# Patient Record
Sex: Female | Born: 1990 | Race: White | Hispanic: No | Marital: Single | Smoking: Former smoker
Health system: Southern US, Community
[De-identification: ages and names within clinical notes are randomized; demographics above are authoritative.]

## PROBLEM LIST (undated history)

## (undated) ENCOUNTER — Inpatient Hospital Stay: Payer: Self-pay

## (undated) DIAGNOSIS — M419 Scoliosis, unspecified: Secondary | ICD-10-CM

## (undated) DIAGNOSIS — Z789 Other specified health status: Secondary | ICD-10-CM

## (undated) DIAGNOSIS — G40909 Epilepsy, unspecified, not intractable, without status epilepticus: Secondary | ICD-10-CM

## (undated) DIAGNOSIS — N83209 Unspecified ovarian cyst, unspecified side: Secondary | ICD-10-CM

## (undated) HISTORY — DX: Epilepsy, unspecified, not intractable, without status epilepticus: G40.909

## (undated) HISTORY — PX: NO PAST SURGERIES: SHX2092

---
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
## 2010-06-04 ENCOUNTER — Emergency Department: Payer: Self-pay | Admitting: Internal Medicine

## 2010-12-26 ENCOUNTER — Inpatient Hospital Stay: Payer: Self-pay

## 2011-09-05 ENCOUNTER — Emergency Department: Payer: Self-pay | Admitting: Internal Medicine

## 2011-09-05 LAB — URINALYSIS, COMPLETE
Bilirubin,UR: NEGATIVE
Glucose,UR: NEGATIVE mg/dL (ref 0–75)
Ketone: NEGATIVE
Nitrite: NEGATIVE
RBC,UR: 11 /HPF (ref 0–5)
Specific Gravity: 1.02 (ref 1.003–1.030)
Squamous Epithelial: 5
WBC UR: 260 /HPF (ref 0–5)

## 2012-12-11 ENCOUNTER — Inpatient Hospital Stay: Payer: Self-pay | Admitting: Obstetrics and Gynecology

## 2012-12-11 LAB — CBC WITH DIFFERENTIAL/PLATELET
Basophil #: 0 10*3/uL (ref 0.0–0.1)
Basophil %: 0.3 %
HCT: 30.6 % — ABNORMAL LOW (ref 35.0–47.0)
HGB: 9.8 g/dL — ABNORMAL LOW (ref 12.0–16.0)
Lymphocyte %: 15.1 %
MCHC: 32.2 g/dL (ref 32.0–36.0)
MCV: 78 fL — ABNORMAL LOW (ref 80–100)
Monocyte %: 6.9 %
Neutrophil #: 9.1 10*3/uL — ABNORMAL HIGH (ref 1.4–6.5)
Neutrophil %: 76.2 %
Platelet: 176 10*3/uL (ref 150–440)
RDW: 16.3 % — ABNORMAL HIGH (ref 11.5–14.5)
WBC: 12 10*3/uL — ABNORMAL HIGH (ref 3.6–11.0)

## 2012-12-11 LAB — GC/CHLAMYDIA PROBE AMP

## 2012-12-12 LAB — HEMATOCRIT: HCT: 26.8 % — ABNORMAL LOW (ref 35.0–47.0)

## 2014-04-09 ENCOUNTER — Emergency Department
Admit: 2014-04-09 | Disposition: A | Discharge status: Home / Self Care | Payer: Self-pay | Admitting: Emergency Medicine

## 2014-04-09 LAB — CBC WITH DIFFERENTIAL/PLATELET
Basophil #: 0.1 10*3/uL (ref 0.0–0.1)
Basophil %: 0.6 %
EOS ABS: 0.3 10*3/uL (ref 0.0–0.7)
EOS PCT: 3.1 %
HCT: 38.1 % (ref 35.0–47.0)
HGB: 12.5 g/dL (ref 12.0–16.0)
LYMPHS PCT: 22.2 %
Lymphocyte #: 2.5 10*3/uL (ref 1.0–3.6)
MCH: 28.6 pg (ref 26.0–34.0)
MCHC: 32.7 g/dL (ref 32.0–36.0)
MCV: 87 fL (ref 80–100)
Monocyte #: 0.7 x10 3/mm (ref 0.2–0.9)
Monocyte %: 6.1 %
NEUTROS ABS: 7.6 10*3/uL — AB (ref 1.4–6.5)
Neutrophil %: 68 %
PLATELETS: 193 10*3/uL (ref 150–440)
RBC: 4.37 10*6/uL (ref 3.80–5.20)
RDW: 15.6 % — AB (ref 11.5–14.5)
WBC: 11.2 10*3/uL — ABNORMAL HIGH (ref 3.6–11.0)

## 2014-04-09 LAB — URINALYSIS, COMPLETE
Bilirubin,UR: NEGATIVE
Blood: NEGATIVE
Glucose,UR: NEGATIVE mg/dL (ref 0–75)
KETONE: NEGATIVE
Leukocyte Esterase: NEGATIVE
Nitrite: NEGATIVE
PH: 7 (ref 4.5–8.0)
PROTEIN: NEGATIVE
RBC,UR: 4 /HPF (ref 0–5)
Specific Gravity: 1.023 (ref 1.003–1.030)
Squamous Epithelial: 49
WBC UR: 7 /HPF (ref 0–5)

## 2014-04-09 LAB — COMPREHENSIVE METABOLIC PANEL
ALK PHOS: 66 U/L
ANION GAP: 7 (ref 7–16)
Albumin: 4.3 g/dL
BILIRUBIN TOTAL: 0.2 mg/dL — AB
BUN: 9 mg/dL
CALCIUM: 8.8 mg/dL — AB
CHLORIDE: 105 mmol/L
CREATININE: 0.57 mg/dL
Co2: 28 mmol/L
EGFR (African American): >60
EGFR (Non-African Amer.): >60
GLUCOSE: 81 mg/dL
POTASSIUM: 3.9 mmol/L
SGOT(AST): 16 U/L
SGPT (ALT): 8 U/L — ABNORMAL LOW
Sodium: 140 mmol/L
Total Protein: 6.9 g/dL

## 2014-04-16 ENCOUNTER — Emergency Department
Admit: 2014-04-16 | Disposition: A | Discharge status: Home / Self Care | Payer: Self-pay | Admitting: Emergency Medicine

## 2014-04-16 LAB — CBC
HCT: 37.7 % (ref 35.0–47.0)
HGB: 12.3 g/dL (ref 12.0–16.0)
MCH: 28.3 pg (ref 26.0–34.0)
MCHC: 32.5 g/dL (ref 32.0–36.0)
MCV: 87 fL (ref 80–100)
Platelet: 215 10*3/uL (ref 150–440)
RBC: 4.33 10*6/uL (ref 3.80–5.20)
RDW: 15.3 % — ABNORMAL HIGH (ref 11.5–14.5)
WBC: 9.4 10*3/uL (ref 3.6–11.0)

## 2014-04-16 LAB — BASIC METABOLIC PANEL
Anion Gap: 7 (ref 7–16)
BUN: 11 mg/dL
CALCIUM: 8.9 mg/dL
CHLORIDE: 106 mmol/L
Co2: 26 mmol/L
Creatinine: 0.5 mg/dL
EGFR (African American): >60
EGFR (Non-African Amer.): >60
Glucose: 89 mg/dL
POTASSIUM: 3.8 mmol/L
Sodium: 139 mmol/L

## 2014-04-16 LAB — URINALYSIS, COMPLETE
BLOOD: NEGATIVE
Bilirubin,UR: NEGATIVE
Glucose,UR: NEGATIVE mg/dL (ref 0–75)
KETONE: NEGATIVE
Nitrite: NEGATIVE
PROTEIN: NEGATIVE
Ph: 6 (ref 4.5–8.0)
SPECIFIC GRAVITY: 1.023 (ref 1.003–1.030)

## 2014-04-16 LAB — WET PREP, GENITAL

## 2014-04-19 LAB — GC/CHLAMYDIA PROBE AMP

## 2014-05-14 NOTE — H&P (Signed)
L&D Evaluation:  History:  HPI 24yo G2P0101 with LMP of 03/01/12 & EDD of 12/06/12 [facility] significant for Anemia, Smoker, +GC, +Chalmydia +Trich in 2012 and treated. GBS neg. Presents today with active labor and cx is 7/100/vtx. Late entry to care at 18 weeks. FOB is [age] yo.   Presents with contractions   Patient's Medical History Anemia,+GC,+Chlamydia, +Trich 2012   Patient's Surgical History none   Medications Pre Natal Vitamins   Allergies NKDA   Social History tobacco  smokes 1-2 cigs a day   Family History Non-Contributory   ROS:  ROS All systems were reviewed.  HEENT, CNS, GI, GU, Respiratory, CV, Renal and Musculoskeletal systems were found to be normal.   Exam:  Vital Signs stable  BP 138/68   General no apparent distress   Mental Status clear   Chest clear   Heart normal sinus rhythm, no murmur/gallop/rubs   Abdomen gravid, non-tender   Estimated Fetal Weight Average for gestational age   Fetal Position vtx   Back no CVAT   Edema no edema   Reflexes 1+   Clonus negative   Pelvic 7/100/vtx-2   Mebranes Intact   FHT normal rate with no decels, initial baseline 118-126 after Stadol: 100-110   Ucx regular   Skin dry   Lymph no lymphadenopathy   Impression:  Impression active labor   Plan:  Plan monitor contractions and for cervical change, Epidural being done   Electronic Signatures: Sharee PimpleJones, Sire Poet W (CNM)  (Signed 08-Dec-14 08:40)  Authored: L&D Evaluation   Last Updated: 08-Dec-14 08:40 by Sharee PimpleJones, Jerold Yoss W (CNM)

## 2014-07-01 ENCOUNTER — Encounter: Payer: Self-pay | Admitting: *Deleted

## 2014-07-01 ENCOUNTER — Emergency Department
Admission: EM | Admit: 2014-07-01 | Discharge: 2014-07-01 | Disposition: A | Discharge status: Home / Self Care | Payer: No Typology Code available for payment source | Attending: Student | Admitting: Student

## 2014-07-01 DIAGNOSIS — S161XXA Strain of muscle, fascia and tendon at neck level, initial encounter: Secondary | ICD-10-CM | POA: Diagnosis not present

## 2014-07-01 DIAGNOSIS — S199XXA Unspecified injury of neck, initial encounter: Secondary | ICD-10-CM | POA: Diagnosis present

## 2014-07-01 DIAGNOSIS — Y9389 Activity, other specified: Secondary | ICD-10-CM | POA: Insufficient documentation

## 2014-07-01 DIAGNOSIS — Z720 Tobacco use: Secondary | ICD-10-CM | POA: Diagnosis not present

## 2014-07-01 DIAGNOSIS — Y92410 Unspecified street and highway as the place of occurrence of the external cause: Secondary | ICD-10-CM | POA: Diagnosis not present

## 2014-07-01 DIAGNOSIS — S46812A Strain of other muscles, fascia and tendons at shoulder and upper arm level, left arm, initial encounter: Secondary | ICD-10-CM | POA: Diagnosis not present

## 2014-07-01 DIAGNOSIS — Y998 Other external cause status: Secondary | ICD-10-CM | POA: Diagnosis not present

## 2014-07-01 MED ORDER — METHOCARBAMOL 500 MG PO TABS: 500.0000 mg | ORAL_TABLET | Freq: Four times a day (QID) | ORAL | Status: DC | PRN

## 2014-07-01 MED ORDER — TRAMADOL HCL 50 MG PO TABS: 50.0000 mg | ORAL_TABLET | Freq: Four times a day (QID) | ORAL | Status: DC | PRN

## 2014-07-01 MED ORDER — IBUPROFEN 800 MG PO TABS: 800.0000 mg | ORAL_TABLET | Freq: Three times a day (TID) | ORAL | Status: DC | PRN
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
## 2014-07-02 ENCOUNTER — Telehealth: Payer: Self-pay | Admitting: Emergency Medicine

## 2014-08-23 ENCOUNTER — Emergency Department: Payer: Self-pay

## 2014-08-23 ENCOUNTER — Emergency Department
Admission: EM | Admit: 2014-08-23 | Discharge: 2014-08-23 | Disposition: A | Discharge status: Home / Self Care | Payer: Self-pay | Attending: Emergency Medicine | Admitting: Emergency Medicine

## 2014-08-23 ENCOUNTER — Encounter: Payer: Self-pay | Admitting: Emergency Medicine

## 2014-08-23 DIAGNOSIS — R1011 Right upper quadrant pain: Secondary | ICD-10-CM | POA: Insufficient documentation

## 2014-08-23 DIAGNOSIS — Z3202 Encounter for pregnancy test, result negative: Secondary | ICD-10-CM | POA: Insufficient documentation

## 2014-08-23 DIAGNOSIS — R109 Unspecified abdominal pain: Secondary | ICD-10-CM

## 2014-08-23 DIAGNOSIS — Z720 Tobacco use: Secondary | ICD-10-CM | POA: Insufficient documentation

## 2014-08-23 DIAGNOSIS — N83201 Unspecified ovarian cyst, right side: Secondary | ICD-10-CM

## 2014-08-23 DIAGNOSIS — N8320 Unspecified ovarian cysts: Secondary | ICD-10-CM | POA: Insufficient documentation

## 2014-08-23 HISTORY — DX: Unspecified ovarian cyst, unspecified side: N83.209

## 2014-08-23 LAB — URINALYSIS COMPLETE WITH MICROSCOPIC (ARMC ONLY)
BILIRUBIN URINE: NEGATIVE
Bacteria, UA: NONE SEEN
Glucose, UA: NEGATIVE mg/dL
Hgb urine dipstick: NEGATIVE
KETONES UR: NEGATIVE mg/dL
Nitrite: NEGATIVE
PROTEIN: NEGATIVE mg/dL
Specific Gravity, Urine: 1.017 (ref 1.005–1.030)
pH: 8 (ref 5.0–8.0)

## 2014-08-23 LAB — COMPREHENSIVE METABOLIC PANEL
ALBUMIN: 4.5 g/dL (ref 3.5–5.0)
ALK PHOS: 61 U/L (ref 38–126)
ALT: 10 U/L — ABNORMAL LOW (ref 14–54)
AST: 17 U/L (ref 15–41)
Anion gap: 5 (ref 5–15)
BILIRUBIN TOTAL: 0.4 mg/dL (ref 0.3–1.2)
BUN: 11 mg/dL (ref 6–20)
CALCIUM: 8.9 mg/dL (ref 8.9–10.3)
CO2: 29 mmol/L (ref 22–32)
CREATININE: 0.6 mg/dL (ref 0.44–1.00)
Chloride: 106 mmol/L (ref 101–111)
GFR calc Af Amer: >60 mL/min (ref >60)
GFR calc non Af Amer: >60 mL/min (ref >60)
GLUCOSE: 93 mg/dL (ref 65–99)
Potassium: 3.7 mmol/L (ref 3.5–5.1)
SODIUM: 140 mmol/L (ref 135–145)
Total Protein: 7.2 g/dL (ref 6.5–8.1)

## 2014-08-23 LAB — CBC
HCT: 38.9 % (ref 35.0–47.0)
Hemoglobin: 12.7 g/dL (ref 12.0–16.0)
MCH: 29.1 pg (ref 26.0–34.0)
MCHC: 32.5 g/dL (ref 32.0–36.0)
MCV: 89.5 fL (ref 80.0–100.0)
PLATELETS: 201 10*3/uL (ref 150–440)
RBC: 4.35 MIL/uL (ref 3.80–5.20)
RDW: 13.3 % (ref 11.5–14.5)
WBC: 5.5 10*3/uL (ref 3.6–11.0)

## 2014-08-23 LAB — LIPASE, BLOOD: Lipase: 29 U/L (ref 22–51)

## 2014-08-23 LAB — POCT PREGNANCY, URINE: Preg Test, Ur: NEGATIVE

## 2014-08-23 MED ORDER — ACETAMINOPHEN 500 MG PO TABS
1000.0000 mg | ORAL_TABLET | ORAL | Status: AC
  Administered 2014-08-23: 1000 mg via ORAL
  Filled 2014-08-23: qty 2

## 2014-08-23 MED ORDER — IBUPROFEN 600 MG PO TABS
600.0000 mg | ORAL_TABLET | ORAL | Status: AC
  Administered 2014-08-23: 600 mg via ORAL
  Filled 2014-08-23: qty 1

## 2014-08-23 NOTE — ED Provider Notes (Signed)
[facility] Emergency Department Provider Note  ____________________________________________  Time seen: Approximately 9:35 AM  I have reviewed the triage vital signs and the nursing notes.   HISTORY  Chief Complaint Abdominal Pain    HPI Mary Wong is a 24 y.o. female with previous history of 2 pregnancies and a known ovarian cyst. She presents today with concerns of intermittent pain on the right side for about 2 weeks. She states she gets an ache and at times sharp pain on the right side of the abdomen, occasionally it seems to locate well in the right upper abdomen. No fevers or chills. No nausea, did vomit twice 2 days ago. She otherwise reports feeling well. No changes in bowel habits. Last menstrual period was approximately 2 weeks ago. Denies pregnancy. No vaginal discharge. No pelvic pain. No pain in the lower abdomen.  She reports that she was told by her dad because she takes a lot of soda that it could be her appendix.   Past Medical History  Diagnosis Date  . Ovarian cyst     There are no active problems to display for this patient.   No past surgical history on file.  Current Outpatient Rx  Name  Route  Sig  Dispense  Refill  . ibuprofen (ADVIL,MOTRIN) 800 MG tablet   Oral   Take 1 tablet (800 mg total) by mouth every 8 (eight) hours as needed.   30 tablet   0   . methocarbamol (ROBAXIN) 500 MG tablet   Oral   Take 1 tablet (500 mg total) by mouth every 6 (six) hours as needed for muscle spasms.   20 tablet   0   . traMADol (ULTRAM) 50 MG tablet   Oral   Take 1 tablet (50 mg total) by mouth every 6 (six) hours as needed.   12 tablet   0     Allergies Review of patient's allergies indicates no known allergies.  No family history on file.  Social History Social History  Substance Use Topics  . Smoking status: Current Every Day Smoker  . Smokeless tobacco: None  . Alcohol Use: No    Review of  Systems Constitutional: No fever/chills Eyes: No visual changes. ENT: No sore throat. Cardiovascular: Denies chest pain. Respiratory: Denies shortness of breath. Gastrointestinal: See history of present illness No diarrhea.  No constipation. Genitourinary: Negative for dysuria. Musculoskeletal: Negative for back pain. Skin: Negative for rash. Neurological: Negative for headaches, focal weakness or numbness.  10-point ROS otherwise negative.  ____________________________________________   PHYSICAL EXAM:  VITAL SIGNS: ED Triage Vitals  Enc Vitals Group     BP 08/23/14 0908 117/63 mmHg     Pulse Rate 08/23/14 0908 75     Resp --      Temp 08/23/14 0908 97.9 F (36.6 C)     Temp src --      SpO2 08/23/14 0908 100 %     Weight 08/23/14 0908 148 lb (67.132 kg)     Height 08/23/14 0908 5\' 10"  (1.778 m)     Head Cir --      Peak Flow --      Pain Score 08/23/14 0908 7     Pain Loc --      Pain Edu? --      Excl. in GC? --     Constitutional: Alert and oriented. Well appearing and in no acute distress. Eyes: Conjunctivae are normal. PERRL. EOMI. Head: Atraumatic. Nose: No congestion/rhinnorhea. Mouth/Throat:  Mucous membranes are moist.  Oropharynx non-erythematous. Neck: No stridor.   Cardiovascular: Normal rate, regular rhythm. Grossly normal heart sounds.  Good peripheral circulation. Respiratory: Normal respiratory effort.  No retractions. Lungs CTAB. Gastrointestinal: Soft and nontender except for some moderate tenderness in the right upper quadrant. There does appear to be a slightly positive Eulah Pont to exam.. No distention. No abdominal bruits. No CVA tenderness. No pain in the lower abdomen, except for some slight tenderness along the suprapubic region without rebound or guarding. Musculoskeletal: No lower extremity tenderness nor edema.  No joint effusions. Neurologic:  Normal speech and language. No gross focal neurologic deficits are appreciated. No gait  instability. Skin:  Skin is warm, dry and intact. No rash noted. Psychiatric: Mood and affect are normal. Speech and behavior are normal.  ____________________________________________   LABS (all labs ordered are listed, but only abnormal results are displayed)  Labs Reviewed  COMPREHENSIVE METABOLIC PANEL - Abnormal; Notable for the following:    ALT 10 (*)    All other components within normal limits  URINALYSIS COMPLETEWITH MICROSCOPIC ([facility] ONLY) - Abnormal; Notable for the following:    Color, Urine YELLOW (*)    APPearance CLEAR (*)    Leukocytes, UA TRACE (*)    Squamous Epithelial / LPF 0-5 (*)    All other components within normal limits  LIPASE, BLOOD  CBC  POC URINE PREG, ED  POC URINE PREG, ED  POCT PREGNANCY, URINE   ____________________________________________  EKG   ____________________________________________  RADIOLOGY  No acute abnormality. Negative for ovarian torsion.  There is a new 1.5 cm heterogeneous structure in the right ovary without significant blood flow. Likely represents a small hemorrhagic or involuting cyst.  The left ovarian cyst has resolved from the previous examination. ____________________________________________   PROCEDURES  Procedure(s) performed: None  Critical Care performed: No  ____________________________________________   INITIAL IMPRESSION / ASSESSMENT AND PLAN / ED COURSE  Pertinent labs & imaging results that were available during my care of the patient were reviewed by me and considered in my medical decision making (see chart for details).  2 weeks of right-sided abdominal pain that is intermittent. Appears to be most focal in the right upper quadrant at this time, and given the patient's history of previous pregnancies and age would consider biliary colic, cholecystitis, but in the setting of her previous cysts with intermittent pain that sometimes sharp we'll also obtain imaging of the ovaries. She has  no pain at McBurney's point, does have some slight tenderness suprapubically but to my examination the right upper quadrant appears to be the most tender. She is afebrile, no signs of peritonitis. I doubt that this represents acute appendicitis, especially given symptoms are intermittent for 2 weeks time.  ----------------------------------------- 1:58 PM on 08/23/2014 -----------------------------------------  Ultrasound shows a probable involuting cyst on the right area on repeat exam, the patient does not have any focal right lower quadrant tenderness. She is in no distress, currently eating graham crackers and feeling well.  I find it highly unlikely this patient would have appendicitis, no pain at McBurney's point, afebrile, normal white count. Cyst likely accounts for her discomfort. I will discharge her to home, but I advised her of careful return precautions including any fevers, vomiting, increasing pain, vaginal bleeding, or further concerns arise she'll come back to emergency room.  She'll follow-up with [facility] who she has seen previously. ____________________________________________   FINAL CLINICAL IMPRESSION(S) / ED DIAGNOSES  Final diagnoses:  Right sided abdominal pain  Right ovarian cyst      Sharyn Creamer, MD 08/23/14 1401

## 2014-08-23 NOTE — ED Notes (Signed)
Pt presents with right side abd pain for two weeks. Thinks it might be her appendix.

## 2014-08-23 NOTE — Discharge Instructions (Signed)
Ovarian Cyst An ovarian cyst is a fluid-filled sac that forms on an ovary. The ovaries are small organs that produce eggs in women. Various types of cysts can form on the ovaries. Most are not cancerous. Many do not cause problems, and they often go away on their own. Some may cause symptoms and require treatment. Common types of ovarian cysts include:  Functional cysts--These cysts may occur every month during the menstrual cycle. This is normal. The cysts usually go away with the next menstrual cycle if the woman does not get pregnant. Usually, there are no symptoms with a functional cyst.  Endometrioma cysts--These cysts form from the tissue that lines the uterus. They are also called "chocolate cysts" because they become filled with blood that turns brown. This type of cyst can cause pain in the lower abdomen during intercourse and with your menstrual period.  Cystadenoma cysts--This type develops from the cells on the outside of the ovary. These cysts can get very big and cause lower abdomen pain and pain with intercourse. This type of cyst can twist on itself, cut off its blood supply, and cause severe pain. It can also easily rupture and cause a lot of pain.  Dermoid cysts--This type of cyst is sometimes found in both ovaries. These cysts may contain different kinds of body tissue, such as skin, teeth, hair, or cartilage. They usually do not cause symptoms unless they get very big.  Theca lutein cysts--These cysts occur when too much of a certain hormone (human chorionic gonadotropin) is produced and overstimulates the ovaries to produce an egg. This is most common after procedures used to assist with the conception of a baby (in vitro fertilization). CAUSES   Fertility drugs can cause a condition in which multiple large cysts are formed on the ovaries. This is called ovarian hyperstimulation syndrome.  A condition called polycystic ovary syndrome can cause hormonal imbalances that can lead  to nonfunctional ovarian cysts. SIGNS AND SYMPTOMS  Many ovarian cysts do not cause symptoms. If symptoms are present, they may include:  Pelvic pain or pressure.  Pain in the lower abdomen.  Pain during sexual intercourse.  Increasing girth (swelling) of the abdomen.  Abnormal menstrual periods.  Increasing pain with menstrual periods.  Stopping having menstrual periods without being pregnant. DIAGNOSIS  These cysts are commonly found during a routine or annual pelvic exam. Tests may be ordered to find out more about the cyst. These tests may include:  Ultrasound.  X-ray of the pelvis.  CT scan.  MRI.  Blood tests. TREATMENT  Many ovarian cysts go away on their own without treatment. Your health care provider may want to check your cyst regularly for 2-3 months to see if it changes. For women in menopause, it is particularly important to monitor a cyst closely because of the higher rate of ovarian cancer in menopausal women. When treatment is needed, it may include any of the following:  A procedure to drain the cyst (aspiration). This may be done using a long needle and ultrasound. It can also be done through a laparoscopic procedure. This involves using a thin, lighted tube with a tiny camera on the end (laparoscope) inserted through a small incision.  Surgery to remove the whole cyst. This may be done using laparoscopic surgery or an open surgery involving a larger incision in the lower abdomen.  Hormone treatment or birth control pills. These methods are sometimes used to help dissolve a cyst. HOME CARE INSTRUCTIONS   Only take over-the-counter  or prescription medicines as directed by your health care provider.  Follow up with your health care provider as directed.  Get regular pelvic exams and Pap tests. SEEK MEDICAL CARE IF:   Your periods are late, irregular, or painful, or they stop.  Your pelvic pain or abdominal pain does not go away.  Your abdomen becomes  larger or swollen.  You have pressure on your bladder or trouble emptying your bladder completely.  You have pain during sexual intercourse.  You have feelings of fullness, pressure, or discomfort in your stomach.  You lose weight for no apparent reason.  You feel generally ill.  You become constipated.  You lose your appetite.  You develop acne.  You have an increase in body and facial hair.  You are gaining weight, without changing your exercise and eating habits.  You think you are pregnant. SEEK IMMEDIATE MEDICAL CARE IF:   You have increasing abdominal pain.  You feel sick to your stomach (nauseous), and you throw up (vomit).  You develop a fever that comes on suddenly.  You have abdominal pain during a bowel movement.  Your menstrual periods become heavier than usual. MAKE SURE YOU:  Understand these instructions.  Will watch your condition.  Will get help right away if you are not doing well or get worse. Document Released: 12/21/2004 Document Revised: 12/26/2012 Document Reviewed: 08/28/2012 [facility] Patient Information 2015 [place], [place]. This information is not intended to replace advice given to you by your health care provider. Make sure you discuss any questions you have with your health care provider.    You have been seen in the Emergency Department (ED) for abdominal pain.    Please follow up as instructed above regarding todays emergent visit and the symptoms that are bothering you.    Return to the ED if your abdominal pain worsens or fails to improve, you develop bloody vomiting, bloody diarrhea, you are unable to tolerate fluids due to vomiting, fever greater than 101, or other symptoms that concern you.   Abdominal Pain, Women Abdominal (stomach, pelvic, or belly) pain can be caused by many things. It is important to tell your doctor:  The location of the pain.  Does it come and go or is it present all the time?  Are there things  that start the pain (eating certain foods, exercise)?  Are there other symptoms associated with the pain (fever, nausea, vomiting, diarrhea)? All of this is helpful to know when trying to find the cause of the pain. CAUSES   Stomach: virus or bacteria infection, or ulcer.  Intestine: appendicitis (inflamed appendix), regional ileitis (Crohn's disease), ulcerative colitis (inflamed colon), irritable bowel syndrome, diverticulitis (inflamed diverticulum of the colon), or cancer of the stomach or intestine.  Gallbladder disease or stones in the gallbladder.  Kidney disease, kidney stones, or infection.  Pancreas infection or cancer.  Fibromyalgia (pain disorder).  Diseases of the female organs:  Uterus: fibroid (non-cancerous) tumors or infection.  Fallopian tubes: infection or tubal pregnancy.  Ovary: cysts or tumors.  Pelvic adhesions (scar tissue).  Endometriosis (uterus lining tissue growing in the pelvis and on the pelvic organs).  Pelvic congestion syndrome (female organs filling up with blood just before the menstrual period).  Pain with the menstrual period.  Pain with ovulation (producing an egg).  Pain with an IUD (intrauterine device, birth control) in the uterus.  Cancer of the female organs.  Functional pain (pain not caused by a disease, may improve without treatment).  Psychological  pain.  Depression. DIAGNOSIS  Your doctor will decide the seriousness of your pain by doing an examination.  Blood tests.  X-rays.  Ultrasound.  CT scan (computed tomography, special type of X-ray).  MRI (magnetic resonance imaging).  Cultures, for infection.  Barium enema (dye inserted in the large intestine, to better view it with X-rays).  Colonoscopy (looking in intestine with a lighted tube).  Laparoscopy (minor surgery, looking in abdomen with a lighted tube).  Major abdominal exploratory surgery (looking in abdomen with a large incision). TREATMENT    The treatment will depend on the cause of the pain.   Many cases can be observed and treated at home.  Over-the-counter medicines recommended by your caregiver.  Prescription medicine.  Antibiotics, for infection.  Birth control pills, for painful periods or for ovulation pain.  Hormone treatment, for endometriosis.  Nerve blocking injections.  Physical therapy.  Antidepressants.  Counseling with a psychologist or psychiatrist.  Minor or major surgery. HOME CARE INSTRUCTIONS   Do not take laxatives, unless directed by your caregiver.  Take over-the-counter pain medicine only if ordered by your caregiver. Do not take aspirin because it can cause an upset stomach or bleeding.  Try a clear liquid diet (broth or water) as ordered by your caregiver. Slowly move to a bland diet, as tolerated, if the pain is related to the stomach or intestine.  Have a thermometer and take your temperature several times a day, and record it.  Bed rest and sleep, if it helps the pain.  Avoid sexual intercourse, if it causes pain.  Avoid stressful situations.  Keep your follow-up appointments and tests, as your caregiver orders.  If the pain does not go away with medicine or surgery, you may try:  Acupuncture.  Relaxation exercises (yoga, meditation).  Group therapy.  Counseling. SEEK MEDICAL CARE IF:   You notice certain foods cause stomach pain.  Your home care treatment is not helping your pain.  You need stronger pain medicine.  You want your IUD removed.  You feel faint or lightheaded.  You develop nausea and vomiting.  You develop a rash.  You are having side effects or an allergy to your medicine. SEEK IMMEDIATE MEDICAL CARE IF:   Your pain does not go away or gets worse.  You have a fever.  Your pain is felt only in portions of the abdomen. The right side could possibly be appendicitis. The left lower portion of the abdomen could be colitis or  diverticulitis.  You are passing blood in your stools (bright red or black tarry stools, with or without vomiting).  You have blood in your urine.  You develop chills, with or without a fever.  You pass out. MAKE SURE YOU:   Understand these instructions.  Will watch your condition.  Will get help right away if you are not doing well or get worse. Document Released: 10/18/2006 Document Revised: 05/07/2013 Document Reviewed: 11/07/2008 [facility] Patient Information 2015 [place], [place]. This information is not intended to replace advice given to you by your health care provider. Make sure you discuss any questions you have with your health care provider.

## 2014-09-04 ENCOUNTER — Encounter: Payer: Self-pay | Admitting: *Deleted

## 2014-09-04 ENCOUNTER — Emergency Department: Payer: Self-pay

## 2014-09-04 ENCOUNTER — Emergency Department
Admission: EM | Admit: 2014-09-04 | Discharge: 2014-09-04 | Disposition: A | Discharge status: Home / Self Care | Payer: Self-pay | Attending: Emergency Medicine | Admitting: Emergency Medicine

## 2014-09-04 DIAGNOSIS — Z720 Tobacco use: Secondary | ICD-10-CM | POA: Insufficient documentation

## 2014-09-04 DIAGNOSIS — N9489 Other specified conditions associated with female genital organs and menstrual cycle: Secondary | ICD-10-CM | POA: Insufficient documentation

## 2014-09-04 DIAGNOSIS — R102 Pelvic and perineal pain: Secondary | ICD-10-CM

## 2014-09-04 DIAGNOSIS — Z3202 Encounter for pregnancy test, result negative: Secondary | ICD-10-CM | POA: Insufficient documentation

## 2014-09-04 LAB — CBC WITH DIFFERENTIAL/PLATELET
BASOS PCT: 1 %
Basophils Absolute: 0 10*3/uL (ref 0–0.1)
EOS ABS: 0.2 10*3/uL (ref 0–0.7)
EOS PCT: 4 %
HCT: 38.5 % (ref 35.0–47.0)
HEMOGLOBIN: 13.1 g/dL (ref 12.0–16.0)
Lymphocytes Relative: 34 %
Lymphs Abs: 2.1 10*3/uL (ref 1.0–3.6)
MCH: 30.6 pg (ref 26.0–34.0)
MCHC: 34.1 g/dL (ref 32.0–36.0)
MCV: 89.8 fL (ref 80.0–100.0)
MONOS PCT: 7 %
Monocytes Absolute: 0.4 10*3/uL (ref 0.2–0.9)
NEUTROS PCT: 54 %
Neutro Abs: 3.5 10*3/uL (ref 1.4–6.5)
PLATELETS: 187 10*3/uL (ref 150–440)
RBC: 4.29 MIL/uL (ref 3.80–5.20)
RDW: 13.4 % (ref 11.5–14.5)
WBC: 6.3 10*3/uL (ref 3.6–11.0)

## 2014-09-04 LAB — COMPREHENSIVE METABOLIC PANEL
ALBUMIN: 4.6 g/dL (ref 3.5–5.0)
ALK PHOS: 57 U/L (ref 38–126)
ALT: 10 U/L — ABNORMAL LOW (ref 14–54)
ANION GAP: 5 (ref 5–15)
AST: 19 U/L (ref 15–41)
BUN: 13 mg/dL (ref 6–20)
CALCIUM: 9.1 mg/dL (ref 8.9–10.3)
CO2: 27 mmol/L (ref 22–32)
Chloride: 107 mmol/L (ref 101–111)
Creatinine, Ser: 0.55 mg/dL (ref 0.44–1.00)
GFR calc Af Amer: >60 mL/min (ref >60)
GFR calc non Af Amer: >60 mL/min (ref >60)
GLUCOSE: 91 mg/dL (ref 65–99)
Potassium: 4 mmol/L (ref 3.5–5.1)
Sodium: 139 mmol/L (ref 135–145)
TOTAL PROTEIN: 7.5 g/dL (ref 6.5–8.1)
Total Bilirubin: 0.4 mg/dL (ref 0.3–1.2)

## 2014-09-04 LAB — URINALYSIS COMPLETE WITH MICROSCOPIC (ARMC ONLY)
BACTERIA UA: NONE SEEN
BILIRUBIN URINE: NEGATIVE
Glucose, UA: NEGATIVE mg/dL
Ketones, ur: NEGATIVE mg/dL
Nitrite: NEGATIVE
PH: 7 (ref 5.0–8.0)
Protein, ur: NEGATIVE mg/dL
Specific Gravity, Urine: 1.03 (ref 1.005–1.030)

## 2014-09-04 LAB — LIPASE, BLOOD: Lipase: 38 U/L (ref 22–51)

## 2014-09-04 LAB — POCT PREGNANCY, URINE: PREG TEST UR: NEGATIVE

## 2014-09-04 MED ORDER — IOHEXOL 240 MG/ML SOLN
25.0000 mL | Freq: Once | INTRAMUSCULAR | Status: AC | PRN
  Administered 2014-09-04: 25 mL via ORAL
  Filled 2014-09-04: qty 25

## 2014-09-04 MED ORDER — HYDROCODONE-ACETAMINOPHEN 5-325 MG PO TABS: 1.0000 | ORAL_TABLET | ORAL | Status: DC | PRN

## 2014-09-04 MED ORDER — IOHEXOL 350 MG/ML SOLN
100.0000 mL | Freq: Once | INTRAVENOUS | Status: DC | PRN
  Filled 2014-09-04: qty 100

## 2014-09-04 MED ORDER — DICYCLOMINE HCL 20 MG PO TABS: 20.0000 mg | ORAL_TABLET | Freq: Three times a day (TID) | ORAL | Status: DC | PRN

## 2014-09-04 NOTE — ED Notes (Signed)
Pt given rx and educated on admin instructions

## 2014-09-04 NOTE — ED Notes (Signed)
Patient transported to CT 

## 2014-09-04 NOTE — ED Provider Notes (Signed)
[facility] Emergency Department Provider Note ____________________________________________  Time seen: Approximately 8:34 AM  I have reviewed the triage vital signs and the nursing notes.   HISTORY  Chief Complaint Pelvic Pain   HPI Mary Wong is a 24 y.o. female who presents today for evaluation of pelvic pain. She states that she has been evaluated here as well as at [facility] for the same. She states that the pain has been present since April and no one has been able to identify the cause other than to tell her that she has ovarian cysts. She states that the pain is diffuse and worse on the left side. She states that the pain worsens after standing for a few hours at work, there is dull pain after intercourse, and there is intermittent periods of sharp pain throughout. She also complains of a bloating feeling. She states that her appetite has been decreased and she becomes nauseated occasionally. She denies vaginal discharge or abnormal vaginal bleeding. She denies constipation or dysuria.   Past Medical History  Diagnosis Date  . Ovarian cyst     There are no active problems to display for this patient.   History reviewed. No pertinent past surgical history.  Current Outpatient Rx  Name  Route  Sig  Dispense  Refill  . dicyclomine (BENTYL) 20 MG tablet   Oral   Take 1 tablet (20 mg total) by mouth 3 (three) times daily as needed for spasms.   30 tablet   0   . HYDROcodone-acetaminophen (NORCO/VICODIN) 5-325 MG per tablet   Oral   Take 1 tablet by mouth every 4 (four) hours as needed for severe pain.   12 tablet   0   . ibuprofen (ADVIL,MOTRIN) 800 MG tablet   Oral   Take 1 tablet (800 mg total) by mouth every 8 (eight) hours as needed.   30 tablet   0   . methocarbamol (ROBAXIN) 500 MG tablet   Oral   Take 1 tablet (500 mg total) by mouth every 6 (six) hours as needed for muscle spasms.   20 tablet   0   . traMADol  (ULTRAM) 50 MG tablet   Oral   Take 1 tablet (50 mg total) by mouth every 6 (six) hours as needed.   12 tablet   0     Allergies Review of patient's allergies indicates no known allergies.  No family history on file.  Social History Social History  Substance Use Topics  . Smoking status: Current Every Day Smoker  . Smokeless tobacco: None  . Alcohol Use: No    Review of Systems Constitutional: No fever/chills Eyes: No visual changes. ENT: No sore throat. Cardiovascular: Denies chest pain. Respiratory: Denies shortness of breath. Gastrointestinal: Positive for abdominal pain. Occasional nausea, no vomiting.  No diarrhea.  No constipation. Genitourinary: Negative for dysuria. Musculoskeletal: Negative for back pain. Skin: Negative for rash. Neurological: Negative for headaches, focal weakness or numbness.  10-point ROS otherwise negative.  ____________________________________________   PHYSICAL EXAM:  VITAL SIGNS: ED Triage Vitals  Enc Vitals Group     BP 09/04/14 0815 116/70 mmHg     Pulse Rate 09/04/14 0815 80     Resp 09/04/14 0815 20     Temp 09/04/14 0815 98 F (36.7 C)     Temp Source 09/04/14 0815 Oral     SpO2 09/04/14 0815 100 %     Weight 09/04/14 0815 144 lb (65.318 kg)  Height 09/04/14 0815  (1.753 m)     Head Cir --      Peak Flow --      Pain Score 09/04/14 0816 6     Pain Loc --      Pain Edu? --      Excl. in GC? --     Constitutional: Alert and oriented. Well appearing and in no acute distress. Eyes: Conjunctivae are normal. PERRL. EOMI. Head: Atraumatic. Nose: No congestion/rhinnorhea. Mouth/Throat: Mucous membranes are moist.  Oropharynx non-erythematous. Neck: No stridor.   Cardiovascular: Normal rate, regular rhythm. Grossly normal heart sounds.  Good peripheral circulation. Respiratory: Normal respiratory effort.  No retractions. Lungs CTAB. Gastrointestinal: Soft and diffusely tender. No distention. No abdominal bruits.  No CVA tenderness. Pelvic exam deferred today in light of 2 recent pelvic exams without a change in symptoms. Musculoskeletal: No lower extremity tenderness nor edema.  No joint effusions. Neurologic:  Normal speech and language. No gross focal neurologic deficits are appreciated. No gait instability. Skin:  Skin is warm, dry and intact. No rash noted. Psychiatric: Mood and affect are normal. Speech and behavior are normal.  ____________________________________________   LABS (all labs ordered are listed, but only abnormal results are displayed)  Labs Reviewed  COMPREHENSIVE METABOLIC PANEL - Abnormal; Notable for the following:    ALT 10 (*)    All other components within normal limits  URINALYSIS COMPLETEWITH MICROSCOPIC ([facility] ONLY) - Abnormal; Notable for the following:    Color, Urine STRAW (*)    APPearance CLEAR (*)    Hgb urine dipstick 1+ (*)    Leukocytes, UA 1+ (*)    Squamous Epithelial / LPF 0-5 (*)    All other components within normal limits  LIPASE, BLOOD  CBC WITH DIFFERENTIAL/PLATELET  CBC WITH DIFFERENTIAL/PLATELET  POCT PREGNANCY, URINE  POC URINE PREG, ED   ____________________________________________  EKG   ____________________________________________  RADIOLOGY  Left greater than right canal vein and parametrial venous enhancement on CT. Small 2.7 cm right adnexal cyst. Left adnexal cyst has resolved. ____________________________________________   PROCEDURES  Procedure(s) performed: None  Critical Care performed: No  ____________________________________________   INITIAL IMPRESSION / ASSESSMENT AND PLAN / ED COURSE  Pertinent labs & imaging results that were available during my care of the patient were reviewed by me and considered in my medical decision making (see chart for details).  Patient was advised to call today to schedule a follow-up appointment with the OB/GYN. The results of her CT scan were discussed and she was advised that  the OB/GYN would likely start her on Depo-Provera. She was advised to return to the emergency department for symptoms that change or worsen if she unable schedule an appointment with Phineas Real or the OB/GYN. ____________________________________________   FINAL CLINICAL IMPRESSION(S) / ED DIAGNOSES  Final diagnoses:  Female pelvic congestion syndrome  Pelvic pain in female      Chinita Pester, FNP 09/04/14 1301  Arnaldo Natal, MD 09/04/14 [phone]

## 2014-09-04 NOTE — Discharge Instructions (Signed)
Call today to schedule an appointment, preferably with the OB GYN.

## 2014-09-04 NOTE — ED Notes (Signed)
Pt states she has had pain in her left side since April. She was diagnosed with left ovarian cyst and followed up with ob-gyn, who told her it would go away. She states she came back here last week and was diagnosed with right ovarian cyst. Pt states pain worse on left side and it has gotten worse. NAD noted.

## 2014-09-04 NOTE — ED Notes (Signed)
Pt seen in ER fro cyst on ovary, pt followed up with OB/GYN who told her it would go away, pt complains of pain in pelvic region

## 2015-06-24 ENCOUNTER — Encounter: Payer: Self-pay | Admitting: Emergency Medicine

## 2015-06-24 ENCOUNTER — Emergency Department
Admission: EM | Admit: 2015-06-24 | Discharge: 2015-06-25 | Disposition: A | Discharge status: Home / Self Care | Payer: Self-pay | Attending: Emergency Medicine | Admitting: Emergency Medicine

## 2015-06-24 DIAGNOSIS — F17210 Nicotine dependence, cigarettes, uncomplicated: Secondary | ICD-10-CM | POA: Insufficient documentation

## 2015-06-24 DIAGNOSIS — N12 Tubulo-interstitial nephritis, not specified as acute or chronic: Secondary | ICD-10-CM | POA: Insufficient documentation

## 2015-06-24 DIAGNOSIS — Z791 Long term (current) use of non-steroidal anti-inflammatories (NSAID): Secondary | ICD-10-CM | POA: Insufficient documentation

## 2015-06-24 LAB — URINALYSIS COMPLETE WITH MICROSCOPIC (ARMC ONLY)
BILIRUBIN URINE: NEGATIVE
Glucose, UA: NEGATIVE mg/dL
KETONES UR: NEGATIVE mg/dL
NITRITE: NEGATIVE
PH: 5 (ref 5.0–8.0)
PROTEIN: 100 mg/dL — AB
SPECIFIC GRAVITY, URINE: 1.017 (ref 1.005–1.030)

## 2015-06-24 LAB — COMPREHENSIVE METABOLIC PANEL
ALK PHOS: 71 U/L (ref 38–126)
ALT: 9 U/L — ABNORMAL LOW (ref 14–54)
AST: 16 U/L (ref 15–41)
Albumin: 4.3 g/dL (ref 3.5–5.0)
Anion gap: 10 (ref 5–15)
BILIRUBIN TOTAL: 0.8 mg/dL (ref 0.3–1.2)
BUN: 12 mg/dL (ref 6–20)
CALCIUM: 8.8 mg/dL — AB (ref 8.9–10.3)
CO2: 26 mmol/L (ref 22–32)
Chloride: 100 mmol/L — ABNORMAL LOW (ref 101–111)
Creatinine, Ser: 0.79 mg/dL (ref 0.44–1.00)
GFR calc Af Amer: >60 mL/min (ref >60)
Glucose, Bld: 135 mg/dL — ABNORMAL HIGH (ref 65–99)
POTASSIUM: 3.3 mmol/L — AB (ref 3.5–5.1)
Sodium: 136 mmol/L (ref 135–145)
TOTAL PROTEIN: 7.8 g/dL (ref 6.5–8.1)

## 2015-06-24 LAB — CBC
HEMATOCRIT: 39.4 % (ref 35.0–47.0)
Hemoglobin: 13.3 g/dL (ref 12.0–16.0)
MCH: 30 pg (ref 26.0–34.0)
MCHC: 33.9 g/dL (ref 32.0–36.0)
MCV: 88.5 fL (ref 80.0–100.0)
PLATELETS: 159 10*3/uL (ref 150–440)
RBC: 4.45 MIL/uL (ref 3.80–5.20)
RDW: 14.6 % — AB (ref 11.5–14.5)
WBC: 14.2 10*3/uL — AB (ref 3.6–11.0)

## 2015-06-24 LAB — LIPASE, BLOOD: Lipase: 17 U/L (ref 11–51)

## 2015-06-24 LAB — POCT PREGNANCY, URINE: Preg Test, Ur: NEGATIVE

## 2015-06-24 NOTE — ED Notes (Signed)
Patient ambulatory to triage with steady gait, without difficulty or distress noted; pt reports x week having mid abd pain radiating thru to back ; st hx ovarian cysts and feels same

## 2015-06-25 ENCOUNTER — Emergency Department: Payer: Self-pay

## 2015-06-25 MED ORDER — IBUPROFEN 600 MG PO TABS
ORAL_TABLET | ORAL | Status: AC
  Administered 2015-06-25: 600 mg via ORAL
  Filled 2015-06-25: qty 1

## 2015-06-25 MED ORDER — IBUPROFEN 600 MG PO TABS
600.0000 mg | ORAL_TABLET | Freq: Once | ORAL | Status: AC
  Administered 2015-06-25: 600 mg via ORAL

## 2015-06-25 MED ORDER — SODIUM CHLORIDE 0.9 % IV BOLUS (SEPSIS)
1000.0000 mL | Freq: Once | INTRAVENOUS | Status: AC
  Administered 2015-06-25: 1000 mL via INTRAVENOUS

## 2015-06-25 MED ORDER — ONDANSETRON HCL 4 MG/2ML IJ SOLN
4.0000 mg | Freq: Once | INTRAMUSCULAR | Status: AC
  Administered 2015-06-25: 4 mg via INTRAVENOUS
  Filled 2015-06-25: qty 2

## 2015-06-25 MED ORDER — MORPHINE SULFATE (PF) 2 MG/ML IV SOLN
2.0000 mg | Freq: Once | INTRAVENOUS | Status: AC
  Administered 2015-06-25: 2 mg via INTRAVENOUS
  Filled 2015-06-25: qty 1

## 2015-06-25 MED ORDER — CIPROFLOXACIN HCL 500 MG PO TABS: 500.0000 mg | ORAL_TABLET | Freq: Two times a day (BID) | ORAL | Status: AC

## 2015-06-25 MED ORDER — CIPROFLOXACIN HCL 500 MG PO TABS
500.0000 mg | ORAL_TABLET | Freq: Once | ORAL | Status: AC
  Administered 2015-06-25: 500 mg via ORAL
  Filled 2015-06-25: qty 1

## 2015-06-25 NOTE — ED Notes (Signed)
Pt discharged to home.  Friend driving.  Discharge instructions reviewed.  Verbalized understanding.  No questions or concerns at this time.  Teach back verified.  Pt in NAD.  No items left in ED.   

## 2015-06-25 NOTE — ED Provider Notes (Signed)
[facility] Emergency Department Provider Note  ____________________________________________  Time seen: 11:45 PM  I have reviewed the triage vital signs and the nursing notes.   HISTORY  Chief Complaint Abdominal Pain     HPI Kerrianne Jeng Roldan is a 25 y.o. female presents with dysuria, urinary frequency urgency and left flank pain times one week. Patient states her current pain score is 8 out of 10 without any aggravating or alleviating factors. Patient admits to subjective fevers at home. Patient denies any vomiting or diarrhea.    Past Medical History  Diagnosis Date  . Ovarian cyst     There are no active problems to display for this patient.   History reviewed. No pertinent past surgical history.  Current Outpatient Rx  Name  Route  Sig  Dispense  Refill  . dicyclomine (BENTYL) 20 MG tablet   Oral   Take 1 tablet (20 mg total) by mouth 3 (three) times daily as needed for spasms.   30 tablet   0   . HYDROcodone-acetaminophen (NORCO/VICODIN) 5-325 MG per tablet   Oral   Take 1 tablet by mouth every 4 (four) hours as needed for severe pain.   12 tablet   0   . ibuprofen (ADVIL,MOTRIN) 800 MG tablet   Oral   Take 1 tablet (800 mg total) by mouth every 8 (eight) hours as needed.   30 tablet   0   . methocarbamol (ROBAXIN) 500 MG tablet   Oral   Take 1 tablet (500 mg total) by mouth every 6 (six) hours as needed for muscle spasms.   20 tablet   0   . traMADol (ULTRAM) 50 MG tablet   Oral   Take 1 tablet (50 mg total) by mouth every 6 (six) hours as needed.   12 tablet   0     Allergies No known drug allergies No family history on file.  Social History Social History  Substance Use Topics  . Smoking status: Current Every Day Smoker -- 0.50 packs/day    Types: Cigarettes  . Smokeless tobacco: None  . Alcohol Use: No    Review of Systems  Constitutional: Positive for fever. Eyes: Negative for visual changes. ENT:  Negative for sore throat. Cardiovascular: Negative for chest pain. Respiratory: Negative for shortness of breath. Gastrointestinal: Negative for abdominal pain, vomiting and diarrhea. Genitourinary: Negative for dysuria. Musculoskeletal:Positive for back pain. Skin: Negative for rash. Neurological: Negative for headaches, focal weakness or numbness.   10-point ROS otherwise negative.  ____________________________________________   PHYSICAL EXAM:  VITAL SIGNS: ED Triage Vitals  Enc Vitals Group     BP 06/24/15 2128 153/100 mmHg     Pulse Rate 06/24/15 2128 110     Resp 06/24/15 2128 18     Temp 06/24/15 2128 99.7 F (37.6 C)     Temp Source 06/24/15 2128 Oral     SpO2 06/24/15 2128 97 %     Weight 06/24/15 2128 154 lb (69.854 kg)     Height 06/24/15 2128  (1.803 m)     Head Cir --      Peak Flow --      Pain Score 06/24/15 2128 8     Pain Loc --      Pain Edu? --      Excl. in GC? --     Constitutional: Alert and oriented. Well appearing and in no distress. Eyes: Conjunctivae are normal. PERRL. Normal extraocular movements. ENT   Head:  Normocephalic and atraumatic.   Nose: No congestion/rhinnorhea.   Mouth/Throat: Mucous membranes are moist.   Neck: No stridor. Hematological/Lymphatic/Immunilogical: No cervical lymphadenopathy. Cardiovascular: Normal rate, regular rhythm. Normal and symmetric distal pulses are present in all extremities. No murmurs, rubs, or gallops. Respiratory: Normal respiratory effort without tachypnea nor retractions. Breath sounds are clear and equal bilaterally. No wheezes/rales/rhonchi. Gastrointestinal: Soft and nontender. No distention. Left CVA tenderness CVA tenderness. Genitourinary: deferred Musculoskeletal: Nontender with normal range of motion in all extremities. No joint effusions.  No lower extremity tenderness nor edema. Neurologic:  Normal speech and language. No gross focal neurologic deficits are appreciated.  Speech is normal.  Skin:  Skin is warm, dry and intact. No rash noted. Psychiatric: Mood and affect are normal. Speech and behavior are normal. Patient exhibits appropriate insight and judgment.  ____________________________________________    LABS (pertinent positives/negatives)  Labs Reviewed  COMPREHENSIVE METABOLIC PANEL - Abnormal; Notable for the following:    Potassium 3.3 (*)    Chloride 100 (*)    Glucose, Bld 135 (*)    Calcium 8.8 (*)    ALT 9 (*)    All other components within normal limits  CBC - Abnormal; Notable for the following:    WBC 14.2 (*)    RDW 14.6 (*)    All other components within normal limits  URINALYSIS COMPLETEWITH MICROSCOPIC ([facility] ONLY) - Abnormal; Notable for the following:    Color, Urine YELLOW (*)    APPearance CLOUDY (*)    Hgb urine dipstick 2+ (*)    Protein, ur 100 (*)    Leukocytes, UA 3+ (*)    Bacteria, UA MANY (*)    Squamous Epithelial / LPF 0-5 (*)    All other components within normal limits  LIPASE, BLOOD  POC URINE PREG, ED  POCT PREGNANCY, URINE      RADIOLOGY  CT Renal Stone Study (Final result) Result time: 06/25/15 01:28:05   Final result by Rad Results In Interface (06/25/15 01:28:05)   Narrative:   CLINICAL DATA: 25 year old female with left flank pain and hematuria.  EXAM: CT ABDOMEN AND PELVIS WITHOUT CONTRAST  TECHNIQUE: Multidetector CT imaging of the abdomen and pelvis was performed following the standard protocol without IV contrast.  COMPARISON: CT of the abdomen pelvis dated 09/04/2014  FINDINGS: Evaluation of this exam is limited in the absence of intravenous contrast.  The visualized lung bases are clear. No intra-abdominal free air or free fluid.  Mild diffuse fatty infiltration of the liver. The gallbladder, pancreas, spleen, adrenal glands, right kidney, visualized right ureter, and urinary bladder appear unremarkable. There is mild fullness of the left renal collecting system  with mild haziness of the left renal pelvis fat. No stone identified. Findings may be related to a recently passed left renal calculus versus pyelonephritis. Correlation with urinalysis recommended. There is apparent diffuse haziness of the bladder wall which may be partly related to underdistention. Cystitis is not excluded. Correlation with urinalysis recommended.  The uterus is anteverted and grossly unremarkable. A 2.1 x 2.7 cm ovoid hyperdensity in the region of the left adnexa may represent loop of bowel or left ovarian dominant follicle/cyst. Ultrasound may provide better evaluation of the pelvic structures. The right ovary appears unremarkable.  There is moderate stool throughout the colon. No evidence of bowel obstruction or active inflammation. Normal appendix.  The abdominal aorta and IVC appear unremarkable on this noncontrast study. No portal venous gas identified. There is no adenopathy. There is a small fat containing  umbilical hernia. The abdominal wall soft tissues appear unremarkable. The osseous structures are intact.  IMPRESSION: Minimal fullness of the left renal collecting system may be related to a recently passed left renal calculus versus UTI. Correlation with urinalysis recommended.  No evidence of bowel obstruction or active inflammation. Normal appendix.   Electronically Signed By: Elgie CollardArash Radparvar M.D. On: 06/25/2015 01:28       INITIAL IMPRESSION / ASSESSMENT AND PLAN / ED COURSE  Pertinent labs & imaging results that were available during my care of the patient were reviewed by me and considered in my medical decision making (see chart for details).  Patient is to Cipro in emergency department will be prescribed same at home. Patient initial temperature 99.7 however at 2:00 AM patient's temperature 103.2 as such she received ibuprofen with improvement temp drip before discharge  99.8.  ____________________________________________   FINAL CLINICAL IMPRESSION(S) / ED DIAGNOSES  Final diagnoses:  Pyelonephritis      Darci Currentandolph N Brown, MD 06/25/15 [phone]

## 2015-06-25 NOTE — ED Notes (Signed)
Pt found to have fever during discharge vitals.  EDP notified.  Pt medicated per MAR.  Cold wash cloths applied to forehead and bilateral axilla.  Pt educated that recheck of vitals to be done in 30 minutes to see if fever has decreased.  Pt educated not to leave prior to getting vitals rechecked.  Verbalized understanding.

## 2015-06-25 NOTE — Discharge Instructions (Signed)

## 2015-09-12 ENCOUNTER — Emergency Department: Payer: Medicaid Other

## 2015-09-12 ENCOUNTER — Emergency Department
Admission: EM | Admit: 2015-09-12 | Discharge: 2015-09-13 | Disposition: A | Discharge status: Home / Self Care | Payer: Medicaid Other | Attending: Emergency Medicine | Admitting: Emergency Medicine

## 2015-09-12 DIAGNOSIS — Z3A11 11 weeks gestation of pregnancy: Secondary | ICD-10-CM | POA: Diagnosis not present

## 2015-09-12 DIAGNOSIS — O98311 Other infections with a predominantly sexual mode of transmission complicating pregnancy, first trimester: Secondary | ICD-10-CM | POA: Insufficient documentation

## 2015-09-12 DIAGNOSIS — A5602 Chlamydial vulvovaginitis: Secondary | ICD-10-CM | POA: Diagnosis not present

## 2015-09-12 DIAGNOSIS — O26891 Other specified pregnancy related conditions, first trimester: Secondary | ICD-10-CM | POA: Diagnosis present

## 2015-09-12 DIAGNOSIS — O208 Other hemorrhage in early pregnancy: Secondary | ICD-10-CM | POA: Diagnosis not present

## 2015-09-12 DIAGNOSIS — O99331 Smoking (tobacco) complicating pregnancy, first trimester: Secondary | ICD-10-CM | POA: Diagnosis not present

## 2015-09-12 DIAGNOSIS — O2341 Unspecified infection of urinary tract in pregnancy, first trimester: Secondary | ICD-10-CM | POA: Insufficient documentation

## 2015-09-12 DIAGNOSIS — O468X1 Other antepartum hemorrhage, first trimester: Secondary | ICD-10-CM

## 2015-09-12 DIAGNOSIS — N390 Urinary tract infection, site not specified: Secondary | ICD-10-CM

## 2015-09-12 DIAGNOSIS — F17210 Nicotine dependence, cigarettes, uncomplicated: Secondary | ICD-10-CM | POA: Diagnosis not present

## 2015-09-12 DIAGNOSIS — O418X10 Other specified disorders of amniotic fluid and membranes, first trimester, not applicable or unspecified: Secondary | ICD-10-CM

## 2015-09-12 DIAGNOSIS — R109 Unspecified abdominal pain: Secondary | ICD-10-CM

## 2015-09-12 DIAGNOSIS — A749 Chlamydial infection, unspecified: Secondary | ICD-10-CM

## 2015-09-12 DIAGNOSIS — O209 Hemorrhage in early pregnancy, unspecified: Secondary | ICD-10-CM

## 2015-09-12 DIAGNOSIS — R1010 Upper abdominal pain, unspecified: Secondary | ICD-10-CM

## 2015-09-12 LAB — HCG, QUANTITATIVE, PREGNANCY: HCG, BETA CHAIN, QUANT, S: 196260 m[IU]/mL — AB (ref <5)

## 2015-09-12 LAB — CBC
HCT: 36.6 % (ref 35.0–47.0)
Hemoglobin: 12.9 g/dL (ref 12.0–16.0)
MCH: 30.9 pg (ref 26.0–34.0)
MCHC: 35.1 g/dL (ref 32.0–36.0)
MCV: 88 fL (ref 80.0–100.0)
PLATELETS: 204 10*3/uL (ref 150–440)
RBC: 4.16 MIL/uL (ref 3.80–5.20)
RDW: 13.6 % (ref 11.5–14.5)
WBC: 11.3 10*3/uL — AB (ref 3.6–11.0)

## 2015-09-12 LAB — COMPREHENSIVE METABOLIC PANEL
ALK PHOS: 48 U/L (ref 38–126)
ALT: 8 U/L — AB (ref 14–54)
AST: 15 U/L (ref 15–41)
Albumin: 3.9 g/dL (ref 3.5–5.0)
Anion gap: 6 (ref 5–15)
BILIRUBIN TOTAL: 0.2 mg/dL — AB (ref 0.3–1.2)
BUN: 9 mg/dL (ref 6–20)
CALCIUM: 9 mg/dL (ref 8.9–10.3)
CHLORIDE: 104 mmol/L (ref 101–111)
CO2: 25 mmol/L (ref 22–32)
CREATININE: 0.46 mg/dL (ref 0.44–1.00)
Glucose, Bld: 100 mg/dL — ABNORMAL HIGH (ref 65–99)
Potassium: 3.5 mmol/L (ref 3.5–5.1)
Sodium: 135 mmol/L (ref 135–145)
TOTAL PROTEIN: 7.4 g/dL (ref 6.5–8.1)

## 2015-09-12 LAB — URINALYSIS COMPLETE WITH MICROSCOPIC (ARMC ONLY)
BILIRUBIN URINE: NEGATIVE
GLUCOSE, UA: NEGATIVE mg/dL
HGB URINE DIPSTICK: NEGATIVE
Ketones, ur: NEGATIVE mg/dL
NITRITE: NEGATIVE
Protein, ur: NEGATIVE mg/dL
SPECIFIC GRAVITY, URINE: 1.021 (ref 1.005–1.030)
pH: 7 (ref 5.0–8.0)

## 2015-09-12 LAB — WET PREP, GENITAL
CLUE CELLS WET PREP: NONE SEEN
Sperm: NONE SEEN
TRICH WET PREP: NONE SEEN
Yeast Wet Prep HPF POC: NONE SEEN

## 2015-09-12 LAB — ABO/RH: ABO/RH(D): O POS

## 2015-09-12 LAB — LIPASE, BLOOD: LIPASE: 31 U/L (ref 11–51)

## 2015-09-12 MED ORDER — ALUM & MAG HYDROXIDE-SIMETH 200-200-20 MG/5ML PO SUSP
30.0000 mL | Freq: Once | ORAL | Status: AC
  Administered 2015-09-12: 30 mL via ORAL
  Filled 2015-09-12: qty 30

## 2015-09-12 MED ORDER — CEPHALEXIN 500 MG PO CAPS: 500.0000 mg | ORAL_CAPSULE | Freq: Three times a day (TID) | ORAL | 0 refills | Status: AC

## 2015-09-12 NOTE — ED Triage Notes (Signed)
Pt. States she is [redacted] weeks pregnant.  Pt. States upper abdominal pain for the past week. Pt. States she vomited once last night. Pt. States hx of kidney infection and ovarian cyst.

## 2015-09-12 NOTE — ED Notes (Signed)
Pt finished with US, transported to room 14 by this RN.

## 2015-09-12 NOTE — Discharge Instructions (Addendum)
1. Take antibiotic as prescribed (Keflex 500 mg 3 times daily 7 days). 2. You may take nausea medicine as needed (Zofran). 3. You were treated for STD in the emergency department. Please notify your sexual partner to seek treatment at the health Department. 4. Return to the ER for worsening symptoms, persistent vomiting, soaking more than 1 pad per hour, fever or other concerns.

## 2015-09-12 NOTE — ED Provider Notes (Signed)
[facility] Emergency Department Provider Note   ____________________________________________   First MD Initiated Contact with Patient 09/12/15 2122     (approximate)  I have reviewed the triage vital signs and the nursing notes.   HISTORY  Chief Complaint Abdominal Pain (Pt. is [redacted] weeks pregnant, with upper abdominal pain for the past two weeks.  Pt. states vomitting once last night.)   HPI Mary Wong is a 25 y.o. female without any chronic medical problems was presented to the emergency department with 2-3 weeks of upper abdominal pain. She says she is about [redacted] weeks pregnant and has not had a prenatal ultrasound as to date. She says that the pain is cramping and is a 5 out of 10 at this time. However, yesterday it was a 10 out of 10 and she said it had her "balled up." She says that she is alsohaving nausea and vomiting. Her husband is at the bedside and says that she has been eating a lot more fluid than normal including some things that may upset her stomach such as cheeseburgers. She denies any burning with urination. Says that she has also had a small amount of vaginal spotting today but denies any discharge. She is also saying that the pain improves when she moves her bowels or passes gas. She has not tried any medications to help relieve the pain.  She is taking prenatal vitamins. [facility] in the past when she had her daughter. She is a G3 P2. Denies any other complications during her previous pregnancies.  Past Medical History:  Diagnosis Date  . Ovarian cyst     There are no active problems to display for this patient.   History reviewed. No pertinent surgical history.  Prior to Admission medications   Medication Sig Start Date End Date Taking? Authorizing Provider  dicyclomine (BENTYL) 20 MG tablet Take 1 tablet (20 mg total) by mouth 3 (three) times daily as needed for spasms.  09/04/14 09/04/15  Chinita Pesterari B Triplett, FNP  HYDROcodone-acetaminophen (NORCO/VICODIN) 5-325 MG per tablet Take 1 tablet by mouth every 4 (four) hours as needed for severe pain. 09/04/14   Chinita Pesterari B Triplett, FNP  ibuprofen (ADVIL,MOTRIN) 800 MG tablet Take 1 tablet (800 mg total) by mouth every 8 (eight) hours as needed. 07/01/14   III Kristine GarbeWilliam C Ruffian, PA-C  methocarbamol (ROBAXIN) 500 MG tablet Take 1 tablet (500 mg total) by mouth every 6 (six) hours as needed for muscle spasms. 07/01/14   III Kristine GarbeWilliam C Ruffian, PA-C  traMADol (ULTRAM) 50 MG tablet Take 1 tablet (50 mg total) by mouth every 6 (six) hours as needed. 07/01/14   III Rosalyn GessWilliam C Ruffian, PA-C    Allergies Review of patient's allergies indicates no known allergies.  History reviewed. No pertinent family history.  Social History Social History  Substance Use Topics  . Smoking status: Current Every Day Smoker    Packs/day: 0.50    Types: Cigarettes  . Smokeless tobacco: Not on file  . Alcohol use No    Review of Systems Constitutional: No fever/chills Eyes: No visual changes. ENT: No sore throat. Cardiovascular: Denies chest pain. Respiratory: Denies shortness of breath. Gastrointestinal:  No diarrhea.  No constipation. Genitourinary: Negative for dysuria. Musculoskeletal: Negative for back pain. Skin: Negative for rash. Neurological: Negative for headaches, focal weakness or numbness.  10-point ROS otherwise negative.  ____________________________________________   PHYSICAL EXAM:  VITAL SIGNS: ED Triage  Vitals  Enc Vitals Group     BP 09/12/15 1948 (!) 119/56     Pulse Rate 09/12/15 1948 78     Resp 09/12/15 1948 18     Temp 09/12/15 1948 98.2 F (36.8 C)     Temp Source 09/12/15 1948 Oral     SpO2 09/12/15 1948 100 %     Weight 09/12/15 1949 157 lb (71.2 kg)     Height 09/12/15 1949 5\' 11"  (1.803 m)     Head Circumference --      Peak Flow --      Pain Score 09/12/15 1949 5     Pain Loc --      Pain Edu? --       Excl. in GC? --     Constitutional: Alert and oriented. Well appearing and in no acute distress. Eyes: Conjunctivae are normal. PERRL. EOMI. Head: Atraumatic. Nose: No congestion/rhinnorhea. Mouth/Throat: Mucous membranes are moist.   Neck: No stridor.   Cardiovascular: Normal rate, regular rhythm. Grossly normal heart sounds.  Good peripheral circulation. Respiratory: Normal respiratory effort.  No retractions. Lungs CTAB. Gastrointestinal: Soft with mild abdominal tenderness across the upper abdomen. Mild right upper quadrant tenderness but it appears less than that on the epigastrium and left side. Negative Murphy sign. No distention Genitourinary:  Normal external exam. Speculum exam with a yellow to greenish discharge. No blood or pooling in the vault. Bimanual exam with a closed cervical os. Mild CMT as well as uterine and bilateral adnexal tenderness that is mild. No masses palpated. Musculoskeletal: No lower extremity tenderness nor edema.  No joint effusions. Neurologic:  Normal speech and language. No gross focal neurologic deficits are appreciated.  Skin:  Skin is warm, dry and intact. No rash noted. Psychiatric: Mood and affect are normal. Speech and behavior are normal.  ____________________________________________   LABS (all labs ordered are listed, but only abnormal results are displayed)  Labs Reviewed  WET PREP, GENITAL - Abnormal; Notable for the following:       Result Value   WBC, Wet Prep HPF POC MODERATE (*)    All other components within normal limits  COMPREHENSIVE METABOLIC PANEL - Abnormal; Notable for the following:    Glucose, Bld 100 (*)    ALT 8 (*)    Total Bilirubin 0.2 (*)    All other components within normal limits  CBC - Abnormal; Notable for the following:    WBC 11.3 (*)    All other components within normal limits  URINALYSIS COMPLETEWITH MICROSCOPIC ([facility] ONLY) - Abnormal; Notable for the following:    Color, Urine YELLOW (*)     APPearance CLOUDY (*)    Leukocytes, UA 3+ (*)    Bacteria, UA RARE (*)    Squamous Epithelial / LPF 6-30 (*)    All other components within normal limits  HCG, QUANTITATIVE, PREGNANCY - Abnormal; Notable for the following:    hCG, Beta Chain, Quant, S 196,260 (*)    All other components within normal limits  CHLAMYDIA/NGC RT PCR ([facility] ONLY)  LIPASE, BLOOD  ABO/RH   ____________________________________________  EKG   ____________________________________________  RADIOLOGY  Pending ultrasound results ____________________________________________   PROCEDURES  Procedure(s) performed:   Procedures  Critical Care performed:   ____________________________________________   INITIAL IMPRESSION / ASSESSMENT AND PLAN / ED COURSE  Pertinent labs & imaging results that were available during my care of the patient were reviewed by me and considered in my medical decision making (see chart for  details).  ----------------------------------------- 11:20 PM on 09/12/2015 -----------------------------------------  Patient said that she did not have relief of her pain with the Maalox. However, she remains calm in the room. No distress. Reassuring vital signs. I believe her pains to be related to gas and GI upset. She says that whenever she moves her bowels or passes gas that the pain improves. Very reassuring blood work including no elevation in her transaminases or lipase. Very unlikely to be gallbladder disease. Also, the pain is not focal to the right upper quadrant and there is a negative Murphy sign.   Patient found to have possible UTI. We'll air on the side of caution because of pregnancy and treat. We'll also send a urine culture. Pending ultrasound results at this time. Signed out to Dr. Dolores Frame.  Clinical Course     ____________________________________________   FINAL CLINICAL IMPRESSION(S) / ED DIAGNOSES  Upper abdominal pain. Vaginal bleeding in  pregnancy.    NEW MEDICATIONS STARTED DURING THIS VISIT:  New Prescriptions   No medications on file     Note:  This document was prepared using Dragon voice recognition software and may include unintentional dictation errors.    Myrna Blazer, MD 09/12/15 [phone]

## 2015-09-13 LAB — CHLAMYDIA/NGC RT PCR (ARMC ONLY)
Chlamydia Tr: DETECTED — AB
N gonorrhoeae: NOT DETECTED

## 2015-09-13 MED ORDER — ONDANSETRON 4 MG PO TBDP: 4.0000 mg | ORAL_TABLET | Freq: Three times a day (TID) | ORAL | 0 refills | Status: DC | PRN

## 2015-09-13 MED ORDER — ONDANSETRON 4 MG PO TBDP
4.0000 mg | ORAL_TABLET | Freq: Once | ORAL | Status: AC
  Administered 2015-09-13: 4 mg via ORAL
  Filled 2015-09-13: qty 1

## 2015-09-13 MED ORDER — AZITHROMYCIN 1 G PO PACK
1.0000 g | PACK | Freq: Once | ORAL | Status: AC
  Administered 2015-09-13: 1 g via ORAL
  Filled 2015-09-13: qty 1

## 2015-09-14 LAB — URINE CULTURE

## 2015-09-15 ENCOUNTER — Telehealth: Payer: Self-pay | Admitting: Emergency Medicine

## 2015-09-15 NOTE — Telephone Encounter (Signed)
Called to make sure pateint aware of culture results.  She did receive azithromycin in the ED.  She says she is awarea and will be [facility]

## 2015-10-28 LAB — OB RESULTS CONSOLE ANTIBODY SCREEN: Antibody Screen: NEGATIVE

## 2015-10-28 LAB — OB RESULTS CONSOLE RPR: RPR: NONREACTIVE

## 2015-10-28 LAB — OB RESULTS CONSOLE HEPATITIS B SURFACE ANTIGEN: Hepatitis B Surface Ag: NEGATIVE

## 2015-10-28 LAB — OB RESULTS CONSOLE VARICELLA ZOSTER ANTIBODY, IGG: Varicella: IMMUNE

## 2015-10-28 LAB — OB RESULTS CONSOLE GC/CHLAMYDIA
Chlamydia: NEGATIVE
GC PROBE AMP, GENITAL: NEGATIVE

## 2015-10-28 LAB — OB RESULTS CONSOLE RUBELLA ANTIBODY, IGM: Rubella: IMMUNE

## 2015-10-28 LAB — OB RESULTS CONSOLE HIV ANTIBODY (ROUTINE TESTING): HIV: NONREACTIVE

## 2015-10-29 ENCOUNTER — Other Ambulatory Visit: Payer: Self-pay | Admitting: Obstetrics and Gynecology

## 2015-10-29 DIAGNOSIS — O0990 Supervision of high risk pregnancy, unspecified, unspecified trimester: Secondary | ICD-10-CM

## 2015-10-29 DIAGNOSIS — IMO0001 Reserved for inherently not codable concepts without codable children: Secondary | ICD-10-CM

## 2015-11-06 ENCOUNTER — Ambulatory Visit
Admission: RE | Admit: 2015-11-06 | Discharge: 2015-11-06 | Disposition: A | Discharge status: Home / Self Care | Payer: Medicaid Other | Source: Ambulatory Visit | Attending: Obstetrics and Gynecology | Admitting: Obstetrics and Gynecology

## 2015-11-06 DIAGNOSIS — O30049 Twin pregnancy, dichorionic/diamniotic, unspecified trimester: Secondary | ICD-10-CM | POA: Insufficient documentation

## 2015-11-06 DIAGNOSIS — IMO0001 Reserved for inherently not codable concepts without codable children: Secondary | ICD-10-CM

## 2015-11-06 DIAGNOSIS — Z3A18 18 weeks gestation of pregnancy: Secondary | ICD-10-CM | POA: Insufficient documentation

## 2015-11-06 DIAGNOSIS — O0990 Supervision of high risk pregnancy, unspecified, unspecified trimester: Secondary | ICD-10-CM | POA: Diagnosis not present

## 2015-11-11 ENCOUNTER — Ambulatory Visit: Payer: Medicaid Other | Attending: Obstetrics and Gynecology | Admitting: Physical Therapy

## 2015-11-11 DIAGNOSIS — M6281 Muscle weakness (generalized): Secondary | ICD-10-CM | POA: Diagnosis present

## 2015-11-11 DIAGNOSIS — R2689 Other abnormalities of gait and mobility: Secondary | ICD-10-CM | POA: Diagnosis not present

## 2015-11-11 NOTE — Therapy (Signed)
[facility] [address] [place], [place], [address] Phone: [phone]   Fax:  [phone]  Physical Therapy Evaluation / Discharge Summary   Patient Details  Name: Mary RheinCarissica F Seehafer MRN: [id number] Date of Birth: 1990-06-08 Referring Provider: Sylvester HarderMeredith Signom   Encounter Date: 11/11/2015      PT End of Session - 11/11/15 1142    Visit Number 1   Number of Visits 1   Date for PT Re-Evaluation 11/12/15   Authorization Type Medicaid coverage is limited to only one visit    PT Start Time 1120   PT Stop Time 1210   PT Time Calculation (min) 50 min   Activity Tolerance Patient tolerated treatment well;No increased pain      Past Medical History:  Diagnosis Date  . Ovarian cyst     No past surgical history on file.  There were no vitals filed for this visit.       Subjective Assessment - 11/11/15 1120    Subjective Pt is pregnant with twins (due 04/06/16) on her third pregnancy. Pt reports L hip pain like "the joint the is not connected" and sometimes it "goes out" and she has to lean over. 8/10 at its worst, 6-7/10.  Pain radiates posterior /anterior thigh above the knee.  Denied previous injury nor fall. Pain occurs with sitting, sit to stand, in/out car, stepping over tub to get intot he shower.              [facility] - 11/11/15 1428      Assessment   Medical Diagnosis L hip pain   Referring Provider Sharyl NimrodMeredith Signom      Precautions   Precautions None     Restrictions   Weight Bearing Restrictions No     Balance Screen   Has the patient fallen in the past 6 months No     Prior Function   Level of Independence Independent     Coordination   Gross Motor Movements are Fluid and Coordinated --  pelvic floor coordination present with contraction     Palpation   SI assessment  R PSIS hypomobile, more posterior > L PSIS    Palpation comment Increased mm tensions and tenderness at  puborectalis L                    [facility] Adult PT Treatment/Exercise - 11/11/15 1434      Therapeutic Activites    Therapeutic Activities --  see pt instructions     Manual Therapy   Manual therapy comments long axis distraction LLE, inferior mob with medial glide of L iliac crest, MWM with hip ext                 PT Education - 11/11/15 1141    Education provided Yes   Education Details POC, anatomy/physiology, goals, HEP   Person(s) Educated Patient   Methods Explanation;Demonstration;Tactile cues;Verbal cues;Handout   Comprehension Verbalized understanding;Returned demonstration          PT Short Term Goals - 11/11/15 1117      PT SHORT TERM GOAL #1   Title Pt will demo IND with HEP   Time 1   Period Days   Status Achieved     PT SHORT TERM GOAL #2   Title Pt will demo proper body mechanics with gait, transfers in/out car/ shower/tub, chair, stair cliimbing in order to manage pain and perform ADLs   Time 1  Period Days   Status Achieved     PT SHORT TERM GOAL #3   Title Pt will report decreased pain from     to    in order to improve QOL   Time 1   Period Days   Status Achieved                  Plan - 11/11/15 1209    Clinical Impression Statement Ptis 25 yo female who is pregnant in her  first trimester with twins and c/o L hip pain that impacts her ability to  sit, walk, and other ADLs. Pt 's clinical presentation showed pelvic  obliquities, gait deviations, poor body mechanics, and  low endurance of  her pelvic floor muscles. Following Tx, pt reports her pain decreased from  a 8/10 to 4/10 and that her L hip "is moving better".  Pt showed improved  gait, increased pelvic girdle mobility, proper pelvic floor strengthening  exercise technique, and body mechanics.  Pt is d/c today because she is  unable to pay of future visits. Pt was provided resources and HEP to  continue with making progress and to minimize pelvic floor  dysfunction  related to pregnancy.     Rehab Potential Good   PT Frequency One time visit   PT Treatment/Interventions Therapeutic activities;Patient/family education;Neuromuscular re-education;Balance training;Therapeutic exercise;Manual techniques   Consulted and Agree with Plan of Care Patient      Patient will benefit from skilled therapeutic intervention in order to improve the following deficits and impairments:  Abnormal gait, Hypomobility, Pain, Decreased endurance, Decreased mobility, Decreased strength, Decreased activity tolerance, Decreased coordination, Decreased safety awareness, Increased muscle spasms  Visit Diagnosis: Other abnormalities of gait and mobility - Plan: PT plan of care cert/re-cert  Muscle weakness (generalized) - Plan: PT plan of care cert/re-cert     Problem List Patient Active Problem List   Diagnosis Date Noted  . Twin pregnancy, twins dichorionic and diamniotic 11/06/2015    Mariane MastersYeung,Shin Yiing ,PT, DPT, E-RYT  11/11/2015, 2:35 PM  [facility] [address] [place], [place], [address] Phone: [phone]   Fax:  [phone]  Name: Mary RheinCarissica F Hallett MRN: [id number] Date of Birth: Sep 02, 1990

## 2015-11-11 NOTE — Patient Instructions (Addendum)
Proper body mechanics with getting out of a chair to decrease strain  on back &pelvic floor   Avoid holding your breath when Getting out of the chair:  Scoot to front part of chair chair Heels behind feet, feet are hip width apart, nose over toes  Inhale like you are smelling roses Exhale to stand   __________  Moving your knees together with getting in/out of car, tub, in bed   __________   Mary Wong 45 Degrees   Lying with hips and knees bent 45, one pillow between knees and ankles. Lift knee with exhale. Be sure pelvis does not roll backward. Do not arch back. Do 10 times, each leg, 2 times per day.  http://ss.exer.us/75   Copyright  VHI. All rights reserved.   ______  PELVIC FLOOR / KEGEL EXERCISES   Pelvic floor/ Kegel exercises are used to strengthen the muscles in the base of your pelvis that are responsible for supporting your pelvic organs and preventing urine/feces leakage. Based on your therapist's recommendations, they can be performed while standing, sitting, or lying down. Imagine pelvic floor area as a diamond with pelvic landmarks: top =pubic bone, bottom tip=tailbone, sides=sitting bones (ischial tuberosities).    Make yourself aware of this muscle group by using these cues while coordinating your breath:  Inhale, feel pelvic floor diamond area lower like hammock towards your feet and ribcage/belly expanding. Pause. Let the exhale naturally and feel your belly sink, abdominal muscles hugging in around you and you may notice the pelvic diamond draws upward towards your head forming a umbrella shape. Give a squeeze during the exhalation like you are stopping the flow of urine. If you are squeezing the buttock muscles, try to give 50% less effort.   Common Errors:  Breath holding: If you are holding your breath, you may be bearing down against your bladder instead of pulling it up. If you belly bulges up while you are squeezing, you are holding your  breath. Be sure to breathe gently in and out while exercising. Counting out loud may help you avoid holding your breath.  Accessory muscle use: You should not see or feel other muscle movement when performing pelvic floor exercises. When done properly, no one can tell that you are performing the exercises. Keep the buttocks, belly and inner thighs relaxed.  Overdoing it: Your muscles can fatigue and stop working for you if you over-exercise. You may actually leak more or feel soreness at the lower abdomen or rectum.  YOUR HOME EXERCISE PROGRAM  LONG HOLDS: Position: on back  Inhale and then exhale. Then squeeze the muscle and count aloud for 3 seconds. Rest with three long breaths. (Be sure to let belly sink in with exhales and not push outward)  Perform 5 repetitions, 5 times/day  SHORT HOLDS: Position: on back, sitting   Inhale and then exhale. Then squeeze the muscle.  (Be sure to let belly sink in with exhales and not push outward)  Perform 10 repetitions, 5  Times/day    _____________  [email or web address]   Mary Wong, PT   _____________                  DECREASE DOWNWARD PRESSURE ON  YOUR PELVIC FLOOR, ABDOMINAL, LOW BACK MUSCLES       PRESERVE YOUR PELVIC HEALTH LONG-TERM   ** SQUEEZE pelvic floor BEFORE YOUR SNEEZE, COUGH, LAUGH   ** EXHALE BEFORE YOU RISE AGAINST GRAVITY (lifting, sit to stand, from squat to stand)   **  LOG ROLL OUT OF BED INSTEAD OF CRUNCH/SIT-UP   _____________   Hip flexor stretch (feet like you are on ski tracks)   5 breaths

## 2015-11-17 ENCOUNTER — Ambulatory Visit: Payer: Medicaid Other | Admitting: Physical Therapy

## 2015-11-29 ENCOUNTER — Observation Stay
Admission: EM | Admit: 2015-11-29 | Discharge: 2015-11-30 | Disposition: A | Discharge status: Home / Self Care | Payer: Medicaid Other | Attending: Obstetrics and Gynecology | Admitting: Obstetrics and Gynecology

## 2015-11-29 DIAGNOSIS — O26899 Other specified pregnancy related conditions, unspecified trimester: Secondary | ICD-10-CM | POA: Diagnosis present

## 2015-11-29 DIAGNOSIS — O30042 Twin pregnancy, dichorionic/diamniotic, second trimester: Secondary | ICD-10-CM | POA: Diagnosis not present

## 2015-11-29 DIAGNOSIS — R102 Pelvic and perineal pain: Secondary | ICD-10-CM | POA: Diagnosis not present

## 2015-11-29 DIAGNOSIS — O26892 Other specified pregnancy related conditions, second trimester: Secondary | ICD-10-CM | POA: Diagnosis not present

## 2015-11-29 DIAGNOSIS — Z3A21 21 weeks gestation of pregnancy: Secondary | ICD-10-CM | POA: Diagnosis not present

## 2015-11-29 LAB — COMPREHENSIVE METABOLIC PANEL
ALBUMIN: 3.1 g/dL — AB (ref 3.5–5.0)
ALT: 10 U/L — ABNORMAL LOW (ref 14–54)
AST: 16 U/L (ref 15–41)
Alkaline Phosphatase: 61 U/L (ref 38–126)
Anion gap: 7 (ref 5–15)
BILIRUBIN TOTAL: 0.5 mg/dL (ref 0.3–1.2)
BUN: 7 mg/dL (ref 6–20)
CHLORIDE: 105 mmol/L (ref 101–111)
CO2: 23 mmol/L (ref 22–32)
Calcium: 8.6 mg/dL — ABNORMAL LOW (ref 8.9–10.3)
Creatinine, Ser: 0.37 mg/dL — ABNORMAL LOW (ref 0.44–1.00)
GFR calc Af Amer: >60 mL/min (ref >60)
GFR calc non Af Amer: >60 mL/min (ref >60)
GLUCOSE: 105 mg/dL — AB (ref 65–99)
POTASSIUM: 3.5 mmol/L (ref 3.5–5.1)
Sodium: 135 mmol/L (ref 135–145)
Total Protein: 6.4 g/dL — ABNORMAL LOW (ref 6.5–8.1)

## 2015-11-29 LAB — URINE DRUG SCREEN, QUALITATIVE (ARMC ONLY)
Amphetamines, Ur Screen: NOT DETECTED
Barbiturates, Ur Screen: NOT DETECTED
Benzodiazepine, Ur Scrn: NOT DETECTED
Cannabinoid 50 Ng, Ur ~~LOC~~: NOT DETECTED
Cocaine Metabolite,Ur ~~LOC~~: NOT DETECTED
MDMA (Ecstasy)Ur Screen: NOT DETECTED
Methadone Scn, Ur: NOT DETECTED
Opiate, Ur Screen: NOT DETECTED
Phencyclidine (PCP) Ur S: NOT DETECTED
Tricyclic, Ur Screen: NOT DETECTED

## 2015-11-29 LAB — URINALYSIS COMPLETE WITH MICROSCOPIC (ARMC ONLY)
Bilirubin Urine: NEGATIVE
GLUCOSE, UA: NEGATIVE mg/dL
HGB URINE DIPSTICK: NEGATIVE
Nitrite: NEGATIVE
Protein, ur: NEGATIVE mg/dL
Specific Gravity, Urine: 1.027 (ref 1.005–1.030)
pH: 5 (ref 5.0–8.0)

## 2015-11-29 LAB — CBC
HCT: 30.7 % — ABNORMAL LOW (ref 35.0–47.0)
Hemoglobin: 10.5 g/dL — ABNORMAL LOW (ref 12.0–16.0)
MCH: 31.3 pg (ref 26.0–34.0)
MCHC: 34.3 g/dL (ref 32.0–36.0)
MCV: 91.2 fL (ref 80.0–100.0)
Platelets: 185 K/uL (ref 150–440)
RBC: 3.36 MIL/uL — ABNORMAL LOW (ref 3.80–5.20)
RDW: 13.6 % (ref 11.5–14.5)
WBC: 14.7 K/uL — ABNORMAL HIGH (ref 3.6–11.0)

## 2015-11-29 MED ORDER — OXYCODONE-ACETAMINOPHEN 5-325 MG PO TABS
1.0000 | ORAL_TABLET | ORAL | Status: DC | PRN
  Administered 2015-11-30 (×3): 1 via ORAL
  Filled 2015-11-29 (×3): qty 1

## 2015-11-29 NOTE — OB Triage Note (Signed)
Patient arrived in triage with complaints of intermittent bilateral lower abdominal pain since approx 1700. Patient states positive fetal movement times two, denies leaking of fluid and vaginal bleeding.  Heart tones obtained by doppler.  Abdominal soft by palpation, toco applied. Discussed plan of care. Patient verbalized understanding.

## 2015-11-29 NOTE — Progress Notes (Addendum)
Mary Wong Raby 1990-10-30 G3 P2@ 6525w5d  Mercie Eoni Di twins presents for lower abd /pelvic pressure . DAting based on early u/s and not on unsure LMP of 07/01/15. No cervical procedures and no prior h/o of PTL / PTD . noLOF , no  vaginal bleeding ,  O;BP 115/69 (BP Location: Left Arm)   Pulse 96   Temp 98.3 Wong (36.8 C) (Oral)   Resp 18   Ht 5\' 9"  (1.753 m)   Wt 163 lb (73.9 kg)   LMP 06/27/2015 (Approximate)   BMI 24.07 kg/m  ABDsoft NT  CX long and closed  NST+ FHM x 2 Labs: neg ua , + ketones   A: pelvic pressure , twin gestation . Rule out cervical funneling  P:observe overnight  Urine drug screen  Cbc  cmp  Percocet 5/325 mg after meal  U/s in am Patient ID: Mary Wong Hasley, female   DOB: 1990-10-30, 25 y.o.   MRN: [id number]   11/30/15 0740 : pain better this am . Pt rested through the night  O:VSS  T=97.9 abd soft Nt  Cx not rechecked  WBC 14 K , other labs including drug screen neg A: Abd pressure improved  P: u/s today and d/c home

## 2015-11-30 ENCOUNTER — Observation Stay: Payer: Medicaid Other

## 2015-11-30 DIAGNOSIS — O26892 Other specified pregnancy related conditions, second trimester: Secondary | ICD-10-CM | POA: Diagnosis not present

## 2015-11-30 NOTE — Discharge Summary (Signed)
  Suzy Bouchardhomas J Patric Vanpelt, MD  Obstetrics    [] Hide copied text [] Hover for attribution information Mary Wong 11-03-1990 G3 P2@ 2051w5d  Mercie Eoni Di twins presents for lower abd /pelvic pressure . DAting based on early u/s and not on unsure LMP of 07/01/15. No cervical procedures and no prior h/o of PTL / PTD . noLOF , no  vaginal bleeding ,  O;BP 115/69 (BP Location: Left Arm)   Pulse 96   Temp 98.3 F (36.8 C) (Oral)   Resp 18   Ht 5\' 9"  (1.753 m)   Wt 163 lb (73.9 kg)   LMP 06/27/2015 (Approximate)   BMI 24.07 kg/m  ABDsoft NT  CX long and closed  NST+ FHM x 2 Labs: neg ua , + ketones   A: pelvic pressure , twin gestation . Rule out cervical funneling  P:observe overnight  Urine drug screen  Cbc  cmp  Percocet 5/325 mg after meal  U/s in am Patient ID: Mary Wong, female   DOB: 11-03-1990, 25 y.o.   MRN: [id number]   11/30/15 0740 : pain better this am . Pt rested through the night  O:VSS  abd soft Nt  Cx not rechecked  A: Abd pressure improved

## 2015-11-30 NOTE — Discharge Instructions (Signed)
Please keep regularly scheduled appointment with [facility] Dalbert [facility]Be[facility]ley [facility].  If you have any questions or concerns please call the office or the on   Abdominal Pain During Pregnancy Belly (abdominal) pain is common during pregnancy. Most of the time, it is not a serious problem. Other times, it can be a sign that something is wrong with the pregnancy. Always tell your doctor if you have belly pain. Follow these instructions at home: Monitor your belly pain for any changes. The following actions may help you feel better:  Do not have sex (intercourse) or put anything in your vagina until you feel better.  Rest until your pain stops.  Drink clear fluids if you feel sick to your stomach (nauseous). Do not eat solid food until you feel better.  Only take medicine as told by your doctor.  Keep all doctor visits as told. Get help right away if:  You are bleeding, leaking fluid, or pieces of tissue come out of your vagina.  You have more pain or cramping.  You keep throwing up (vomiting).  You have pain when you pee (urinate) or have blood in your pee.  You have a fever.  You do not feel your baby moving as much.  You feel very weak or feel like passing out.  You have trouble breathing, with or without belly pain.  You have a very bad headache and belly pain.  You have fluid leaking from your vagina and belly pain.  You keep having watery poop (diarrhea).  Your belly pain does not go away after resting, or the pain gets worse. This information is not intended to replace advice given to you by your health care provider. Make sure you discuss any questions you have with your health care provider. Document Released: 12/09/2008 Document Revised: 07/30/2015 Document Reviewed: 07/20/2012 Elsevier Interactive Patient Education  2017 [employer] call provider.  If you have urgent concerns please go to the nearest emergency department for further evaluation.

## 2016-01-05 NOTE — L&D Delivery Note (Signed)
Estimated Date of Delivery: 04/05/16 Patient's last menstrual period was 06/27/2015 (approximate). EGA: 9839w6d    Katrinka BlazingSmith, Girl Georgette Dover Devri [[id number]][[id number]]  Delivery Note At 4:50 AM a viable female was delivered via C-Section, Low Transverse (Presentation: cephalic).  APGAR: 8, ; weight 4 lb 13.1 oz (2185 g).   Placenta status: expressed  Cord: 3 vessels,  with the following complications: none noted.  Cord pH: not collected.  SEE OP NOTE  Mom to postpartum.  Baby to NICU.  Verley Pariseau C Ronith Berti 02/29/2016, 11:59 AM     Langston ReusingSmith, BoyB Shuntell [[id number]][[id number]]  Delivery Note At 4:53 AM a viable female was delivered via C-Section, Low Transverse (Presentation: footling breech).  APGAR: 7, 9; weight 5 lb 13.5 oz (2650 g).   Placenta status: expressed.  Cord: thick, 3 vessels,  with the following complications: .  Cord pH: not collected  SEE OP NOTE  Mom to postpartum.  Baby to NICU.  Delyle Weider C Shonita Rinck 02/29/2016, 11:59 AM

## 2016-01-20 ENCOUNTER — Inpatient Hospital Stay: Payer: Medicaid Other

## 2016-01-20 ENCOUNTER — Inpatient Hospital Stay
Admission: EM | Admit: 2016-01-20 | Discharge: 2016-01-20 | Disposition: A | Discharge status: Home / Self Care | Payer: Medicaid Other | Attending: Obstetrics and Gynecology | Admitting: Obstetrics and Gynecology

## 2016-01-20 DIAGNOSIS — Z3A29 29 weeks gestation of pregnancy: Secondary | ICD-10-CM | POA: Insufficient documentation

## 2016-01-20 DIAGNOSIS — O26893 Other specified pregnancy related conditions, third trimester: Secondary | ICD-10-CM | POA: Diagnosis not present

## 2016-01-20 DIAGNOSIS — R109 Unspecified abdominal pain: Secondary | ICD-10-CM | POA: Diagnosis present

## 2016-01-20 DIAGNOSIS — R252 Cramp and spasm: Secondary | ICD-10-CM | POA: Insufficient documentation

## 2016-01-20 DIAGNOSIS — O30043 Twin pregnancy, dichorionic/diamniotic, third trimester: Secondary | ICD-10-CM | POA: Insufficient documentation

## 2016-01-20 DIAGNOSIS — O26899 Other specified pregnancy related conditions, unspecified trimester: Secondary | ICD-10-CM

## 2016-01-20 DIAGNOSIS — O3433 Maternal care for cervical incompetence, third trimester: Secondary | ICD-10-CM

## 2016-01-20 HISTORY — DX: Other specified health status: Z78.9

## 2016-01-20 LAB — URINALYSIS, COMPLETE (UACMP) WITH MICROSCOPIC
BACTERIA UA: NONE SEEN
BILIRUBIN URINE: NEGATIVE
Glucose, UA: NEGATIVE mg/dL
Hgb urine dipstick: NEGATIVE
Ketones, ur: NEGATIVE mg/dL
Nitrite: NEGATIVE
PROTEIN: NEGATIVE mg/dL
SPECIFIC GRAVITY, URINE: 1.018 (ref 1.005–1.030)
pH: 7 (ref 5.0–8.0)

## 2016-01-20 MED ORDER — ACETAMINOPHEN 325 MG PO TABS
650.0000 mg | ORAL_TABLET | Freq: Once | ORAL | Status: AC
  Administered 2016-01-20: 650 mg via ORAL

## 2016-01-20 MED ORDER — ACETAMINOPHEN 325 MG PO TABS
ORAL_TABLET | ORAL | Status: AC
  Administered 2016-01-20: 650 mg via ORAL
  Filled 2016-01-20: qty 2

## 2016-01-20 NOTE — OB Triage Note (Signed)
Mary Wong here with c/o lower abdominal pressure and discomfort, especially with position change, reports tightening occasionally. Also reports decreased fetal movement from twin A, boy, on right side. Reports positive fetal movement from twin B, girl, on left side. Denies bleeding, LOF. Last intercourse 4 days ago.

## 2016-01-20 NOTE — Progress Notes (Signed)
Patient ID: Mary Wong, female   DOB: 03/19/1990, 26 y.o.   MRN: [id number] Mary Wong 03/19/1990 G3 P2 1765w1d presents for  Twin gestation c/o cramping for the last 1 day . After a few hours observation pt states the cramping has been going on for the last month. Also c/o calf bilateral cramping . No recent intercourse .  noLOF , no vaginal bleeding , + tobacco use  PMHXanemia Past OB : h/o PTD  O;BP 126/78 (BP Location: Left Arm)   Pulse 93   Temp 98.4 F (36.9 C) (Oral)   Resp 18   Ht 5\' 9"  (1.753 m)   Wt 190 lb (86.2 kg)   LMP 06/27/2015 (Approximate)   BMI 28.06 kg/m  ABDsoft , non tender CX long closed , repeat cx exam unchanged 6 hours later  NSTreactive NST A+B Labs: ua neg U/S : internal os funneling . Cervical length 2.8 cm . No adnexal masses A: lower abd pain , etiology  Possible round ligament , scar tissue  Or fetal position . cx shortening at 2.8 cm  P:pelvic rest . Po hydration , tylenol prn  rtc if worse

## 2016-01-20 NOTE — Discharge Summary (Signed)
  Suzy Bouchardhomas J Schermerhorn, MD  Obstetrics    [] Hide copied text [] Hover for attribution information Patient ID: Mary RheinCarissica F Wong, female   DOB: November 24, 1990, 26 y.o.   MRN: [id number] Mary Wong November 24, 1990 G3 P2 3736w1d presents for  Twin gestation c/o cramping for the last 1 day . After a few hours observation pt states the cramping has been going on for the last month. Also c/o calf bilateral cramping . No recent intercourse .  noLOF , no vaginal bleeding , + tobacco use  PMHXanemia Past OB : h/o PTD  O;BP 126/78 (BP Location: Left Arm)   Pulse 93   Temp 98.4 F (36.9 C) (Oral)   Resp 18   Ht 5\' 9"  (1.753 m)   Wt 190 lb (86.2 kg)   LMP 06/27/2015 (Approximate)   BMI 28.06 kg/m  ABDsoft , non tender CX long closed , repeat cx exam unchanged 6 hours later  NSTreactive NST A+B Labs: ua neg U/S : internal os funneling . Cervical length 2.8 cm . No adnexal masses A: lower abd pain , etiology  Possible round ligament , scar tissue  Or fetal position . cx shortening at 2.8 cm  P:pelvic rest . Po hydration , tylenol prn  rtc if worse     Electronically signed by Suzy Bouchardhomas J Schermerhorn, MD at 01/20/2016 10:58 PM

## 2016-01-20 NOTE — Progress Notes (Signed)
While in room with provider, pt now states pain has been going on for a month, during initial assessment, pt reported pain starting yesterday. Pt was also became tearful, stating "I can't deal with this anymore". Went back into room 15 minutes later to re-assess pt emotional state, she is still tearful. RN inquired if there was anything else bothering her, pt denies any other issues but remains tearful.

## 2016-01-20 NOTE — OB Triage Note (Signed)
Patient given discharge instructions including preterm labor precautions, fetal kick count instructions, and follow up information. Patient verbalized understanding. Patient left in stable condition via wheelchair accompanied by significant other.

## 2016-02-17 ENCOUNTER — Encounter: Payer: Self-pay | Admitting: Emergency Medicine

## 2016-02-17 ENCOUNTER — Emergency Department: Payer: Medicaid Other

## 2016-02-17 DIAGNOSIS — Z3A33 33 weeks gestation of pregnancy: Secondary | ICD-10-CM | POA: Insufficient documentation

## 2016-02-17 DIAGNOSIS — O9A213 Injury, poisoning and certain other consequences of external causes complicating pregnancy, third trimester: Secondary | ICD-10-CM | POA: Insufficient documentation

## 2016-02-17 DIAGNOSIS — S80212A Abrasion, left knee, initial encounter: Secondary | ICD-10-CM | POA: Diagnosis not present

## 2016-02-17 DIAGNOSIS — Y939 Activity, unspecified: Secondary | ICD-10-CM | POA: Diagnosis not present

## 2016-02-17 DIAGNOSIS — W109XXA Fall (on) (from) unspecified stairs and steps, initial encounter: Secondary | ICD-10-CM | POA: Insufficient documentation

## 2016-02-17 DIAGNOSIS — M25572 Pain in left ankle and joints of left foot: Secondary | ICD-10-CM | POA: Insufficient documentation

## 2016-02-17 DIAGNOSIS — Y999 Unspecified external cause status: Secondary | ICD-10-CM | POA: Insufficient documentation

## 2016-02-17 DIAGNOSIS — F17210 Nicotine dependence, cigarettes, uncomplicated: Secondary | ICD-10-CM | POA: Insufficient documentation

## 2016-02-17 DIAGNOSIS — Y929 Unspecified place or not applicable: Secondary | ICD-10-CM | POA: Diagnosis not present

## 2016-02-17 DIAGNOSIS — Z79899 Other long term (current) drug therapy: Secondary | ICD-10-CM | POA: Insufficient documentation

## 2016-02-17 DIAGNOSIS — S99912A Unspecified injury of left ankle, initial encounter: Secondary | ICD-10-CM | POA: Diagnosis present

## 2016-02-17 NOTE — ED Notes (Signed)
Spoke with MD, advised to scan knee and ankle, once cleared to send to L/D.

## 2016-02-17 NOTE — ED Triage Notes (Signed)
Pt brought to triage in wheelchair due to mechanical fall today. Pt c/o left ankle and knee pain, post missing step on porch and catching herself with left knee. Pt is [redacted] weeks gestation with twins. Abrasions and swelling noted to left knee.

## 2016-02-18 ENCOUNTER — Emergency Department
Admission: EM | Admit: 2016-02-18 | Discharge: 2016-02-18 | Disposition: A | Discharge status: Home / Self Care | Payer: Medicaid Other | Attending: Emergency Medicine | Admitting: Emergency Medicine

## 2016-02-29 ENCOUNTER — Inpatient Hospital Stay: Payer: Medicaid Other | Admitting: Anesthesiology

## 2016-02-29 ENCOUNTER — Inpatient Hospital Stay
Admission: EM | Admit: 2016-02-29 | Discharge: 2016-03-03 | DRG: 765 | Disposition: A | Discharge status: Home / Self Care | Payer: Medicaid Other | Attending: Obstetrics & Gynecology | Admitting: Obstetrics & Gynecology

## 2016-02-29 ENCOUNTER — Encounter
Admission: EM | Disposition: A | Discharge status: Home / Self Care | Payer: Self-pay | Source: Home / Self Care | Attending: Obstetrics & Gynecology

## 2016-02-29 DIAGNOSIS — O30043 Twin pregnancy, dichorionic/diamniotic, third trimester: Secondary | ICD-10-CM | POA: Diagnosis present

## 2016-02-29 DIAGNOSIS — O09219 Supervision of pregnancy with history of pre-term labor, unspecified trimester: Secondary | ICD-10-CM

## 2016-02-29 DIAGNOSIS — O99344 Other mental disorders complicating childbirth: Secondary | ICD-10-CM | POA: Diagnosis present

## 2016-02-29 DIAGNOSIS — O365930 Maternal care for other known or suspected poor fetal growth, third trimester, not applicable or unspecified: Secondary | ICD-10-CM

## 2016-02-29 DIAGNOSIS — O30003 Twin pregnancy, unspecified number of placenta and unspecified number of amniotic sacs, third trimester: Secondary | ICD-10-CM | POA: Diagnosis present

## 2016-02-29 DIAGNOSIS — F329 Major depressive disorder, single episode, unspecified: Secondary | ICD-10-CM | POA: Diagnosis present

## 2016-02-29 DIAGNOSIS — O0930 Supervision of pregnancy with insufficient antenatal care, unspecified trimester: Secondary | ICD-10-CM

## 2016-02-29 DIAGNOSIS — O9962 Diseases of the digestive system complicating childbirth: Secondary | ICD-10-CM | POA: Diagnosis present

## 2016-02-29 DIAGNOSIS — O99019 Anemia complicating pregnancy, unspecified trimester: Secondary | ICD-10-CM | POA: Diagnosis present

## 2016-02-29 DIAGNOSIS — O99340 Other mental disorders complicating pregnancy, unspecified trimester: Secondary | ICD-10-CM | POA: Diagnosis present

## 2016-02-29 DIAGNOSIS — O329XX2 Maternal care for malpresentation of fetus, unspecified, fetus 2: Secondary | ICD-10-CM | POA: Diagnosis present

## 2016-02-29 DIAGNOSIS — Z302 Encounter for sterilization: Secondary | ICD-10-CM | POA: Diagnosis not present

## 2016-02-29 DIAGNOSIS — O99334 Smoking (tobacco) complicating childbirth: Secondary | ICD-10-CM | POA: Diagnosis present

## 2016-02-29 DIAGNOSIS — F17210 Nicotine dependence, cigarettes, uncomplicated: Secondary | ICD-10-CM | POA: Diagnosis present

## 2016-02-29 DIAGNOSIS — O321XX2 Maternal care for breech presentation, fetus 2: Principal | ICD-10-CM | POA: Diagnosis present

## 2016-02-29 DIAGNOSIS — O9081 Anemia of the puerperium: Secondary | ICD-10-CM | POA: Diagnosis not present

## 2016-02-29 DIAGNOSIS — Z3A34 34 weeks gestation of pregnancy: Secondary | ICD-10-CM

## 2016-02-29 DIAGNOSIS — O09899 Supervision of other high risk pregnancies, unspecified trimester: Secondary | ICD-10-CM

## 2016-02-29 DIAGNOSIS — K219 Gastro-esophageal reflux disease without esophagitis: Secondary | ICD-10-CM | POA: Diagnosis present

## 2016-02-29 DIAGNOSIS — F32A Depression, unspecified: Secondary | ICD-10-CM | POA: Diagnosis present

## 2016-02-29 DIAGNOSIS — Z98891 History of uterine scar from previous surgery: Secondary | ICD-10-CM

## 2016-02-29 DIAGNOSIS — O99333 Smoking (tobacco) complicating pregnancy, third trimester: Secondary | ICD-10-CM | POA: Diagnosis present

## 2016-02-29 DIAGNOSIS — D62 Acute posthemorrhagic anemia: Secondary | ICD-10-CM | POA: Diagnosis not present

## 2016-02-29 DIAGNOSIS — O30049 Twin pregnancy, dichorionic/diamniotic, unspecified trimester: Secondary | ICD-10-CM | POA: Diagnosis present

## 2016-02-29 LAB — TYPE AND SCREEN
ABO/RH(D): O POS
Antibody Screen: NEGATIVE

## 2016-02-29 LAB — CBC
HCT: 30 % — ABNORMAL LOW (ref 35.0–47.0)
Hemoglobin: 10.1 g/dL — ABNORMAL LOW (ref 12.0–16.0)
MCH: 28.2 pg (ref 26.0–34.0)
MCHC: 33.6 g/dL (ref 32.0–36.0)
MCV: 84.1 fL (ref 80.0–100.0)
PLATELETS: 178 10*3/uL (ref 150–440)
RBC: 3.57 MIL/uL — ABNORMAL LOW (ref 3.80–5.20)
RDW: 16.5 % — AB (ref 11.5–14.5)
WBC: 18.2 10*3/uL — ABNORMAL HIGH (ref 3.6–11.0)

## 2016-02-29 SURGERY — Surgical Case
Anesthesia: Spinal

## 2016-02-29 MED ORDER — IBUPROFEN 600 MG PO TABS
600.0000 mg | ORAL_TABLET | Freq: Four times a day (QID) | ORAL | Status: DC | PRN
  Administered 2016-03-01 – 2016-03-02 (×3): 600 mg via ORAL
  Filled 2016-02-29 (×4): qty 1

## 2016-02-29 MED ORDER — LACTATED RINGERS IV SOLN
INTRAVENOUS | Status: DC | PRN
  Administered 2016-02-29: 04:00:00 via INTRAVENOUS

## 2016-02-29 MED ORDER — NALOXONE HCL 0.4 MG/ML IJ SOLN: 0.4000 mg | INTRAMUSCULAR | Status: DC | PRN

## 2016-02-29 MED ORDER — SODIUM CHLORIDE 0.9 % IV SOLN: 250.0000 mL | INTRAVENOUS | Status: DC

## 2016-02-29 MED ORDER — DIBUCAINE 1 % RE OINT: 1.0000 "application " | TOPICAL_OINTMENT | RECTAL | Status: DC | PRN

## 2016-02-29 MED ORDER — BUPIVACAINE HCL (PF) 0.5 % IJ SOLN
INTRAMUSCULAR | Status: AC
  Filled 2016-02-29: qty 10

## 2016-02-29 MED ORDER — BUPIVACAINE HCL (PF) 0.5 % IJ SOLN
30.0000 mL | Freq: Once | INTRAMUSCULAR | Status: DC
  Filled 2016-02-29: qty 30

## 2016-02-29 MED ORDER — LACTATED RINGERS IV SOLN: INTRAVENOUS | Status: DC

## 2016-02-29 MED ORDER — ACETAMINOPHEN 325 MG PO TABS
650.0000 mg | ORAL_TABLET | ORAL | Status: DC | PRN
  Administered 2016-02-29 – 2016-03-03 (×8): 650 mg via ORAL
  Filled 2016-02-29 (×8): qty 2

## 2016-02-29 MED ORDER — NALBUPHINE HCL 10 MG/ML IJ SOLN
5.0000 mg | INTRAMUSCULAR | Status: DC | PRN
Start: 2016-02-29 — End: 2016-03-03
  Administered 2016-02-29 (×2): 5 mg via INTRAVENOUS
  Filled 2016-02-29 (×3): qty 1

## 2016-02-29 MED ORDER — ONDANSETRON HCL 4 MG/2ML IJ SOLN: 4.0000 mg | Freq: Once | INTRAMUSCULAR | Status: DC | PRN

## 2016-02-29 MED ORDER — ACETAMINOPHEN 10 MG/ML IV SOLN
INTRAVENOUS | Status: AC
  Filled 2016-02-29: qty 100

## 2016-02-29 MED ORDER — METHYLERGONOVINE MALEATE 0.2 MG/ML IJ SOLN
INTRAMUSCULAR | Status: DC | PRN
  Administered 2016-02-29: 0.2 mg via INTRAMUSCULAR

## 2016-02-29 MED ORDER — FENTANYL CITRATE (PF) 100 MCG/2ML IJ SOLN
INTRAMUSCULAR | Status: DC | PRN
  Administered 2016-02-29 (×2): 50 ug via INTRAVENOUS

## 2016-02-29 MED ORDER — BUPIVACAINE HCL (PF) 0.5 % IJ SOLN
INTRAMUSCULAR | Status: AC
  Filled 2016-02-29: qty 30

## 2016-02-29 MED ORDER — SODIUM CHLORIDE 0.9% FLUSH: 3.0000 mL | Freq: Two times a day (BID) | INTRAVENOUS | Status: DC

## 2016-02-29 MED ORDER — NALBUPHINE HCL 10 MG/ML IJ SOLN
5.0000 mg | INTRAMUSCULAR | Status: DC | PRN
  Administered 2016-02-29 – 2016-03-01 (×2): 5 mg via SUBCUTANEOUS
  Filled 2016-02-29 (×2): qty 1

## 2016-02-29 MED ORDER — FENTANYL CITRATE (PF) 100 MCG/2ML IJ SOLN
INTRAMUSCULAR | Status: AC
  Filled 2016-02-29: qty 2

## 2016-02-29 MED ORDER — MIDAZOLAM HCL 2 MG/2ML IJ SOLN
INTRAMUSCULAR | Status: DC | PRN
  Administered 2016-02-29: 2 mg via INTRAVENOUS

## 2016-02-29 MED ORDER — OXYTOCIN 40 UNITS IN LACTATED RINGERS INFUSION - SIMPLE MED
INTRAVENOUS | Status: AC
  Filled 2016-02-29: qty 1000

## 2016-02-29 MED ORDER — MORPHINE SULFATE-NACL 0.5-0.9 MG/ML-% IV SOSY
PREFILLED_SYRINGE | INTRAVENOUS | Status: DC | PRN
  Administered 2016-02-29: 3 mg via INTRAVENOUS
  Administered 2016-02-29: .1 mg via EPIDURAL
  Administered 2016-02-29: 1.9 mg via INTRAVENOUS

## 2016-02-29 MED ORDER — BUPIVACAINE LIPOSOME 1.3 % IJ SUSP
INTRAMUSCULAR | Status: AC
  Filled 2016-02-29: qty 20

## 2016-02-29 MED ORDER — NALBUPHINE HCL 10 MG/ML IJ SOLN
5.0000 mg | Freq: Once | INTRAMUSCULAR | Status: AC
  Administered 2016-02-29: 5 mg via INTRAVENOUS

## 2016-02-29 MED ORDER — IBUPROFEN 600 MG PO TABS
600.0000 mg | ORAL_TABLET | Freq: Four times a day (QID) | ORAL | Status: DC
  Administered 2016-02-29 – 2016-03-03 (×11): 600 mg via ORAL
  Filled 2016-02-29 (×11): qty 1

## 2016-02-29 MED ORDER — METHYLERGONOVINE MALEATE 0.2 MG/ML IJ SOLN
INTRAMUSCULAR | Status: AC
  Filled 2016-02-29: qty 1

## 2016-02-29 MED ORDER — DIPHENHYDRAMINE HCL 50 MG/ML IJ SOLN
INTRAMUSCULAR | Status: AC
  Filled 2016-02-29: qty 1

## 2016-02-29 MED ORDER — PROPOFOL 10 MG/ML IV BOLUS
INTRAVENOUS | Status: AC
  Filled 2016-02-29: qty 20

## 2016-02-29 MED ORDER — FENTANYL CITRATE (PF) 100 MCG/2ML IJ SOLN
INTRAMUSCULAR | Status: AC
  Administered 2016-02-29: 25 ug via INTRAVENOUS
  Filled 2016-02-29: qty 2

## 2016-02-29 MED ORDER — ONDANSETRON HCL 4 MG/2ML IJ SOLN
INTRAMUSCULAR | Status: AC
  Filled 2016-02-29: qty 2

## 2016-02-29 MED ORDER — MIDAZOLAM HCL 2 MG/2ML IJ SOLN
INTRAMUSCULAR | Status: AC
  Filled 2016-02-29: qty 2

## 2016-02-29 MED ORDER — MORPHINE SULFATE (PF) 0.5 MG/ML IJ SOLN
INTRAMUSCULAR | Status: AC
  Filled 2016-02-29: qty 10

## 2016-02-29 MED ORDER — SODIUM CHLORIDE 0.9% FLUSH
3.0000 mL | INTRAVENOUS | Status: DC | PRN
Start: 2016-02-29 — End: 2016-03-01

## 2016-02-29 MED ORDER — PHENYLEPHRINE 40 MCG/ML (10ML) SYRINGE FOR IV PUSH (FOR BLOOD PRESSURE SUPPORT)
PREFILLED_SYRINGE | INTRAVENOUS | Status: DC | PRN
  Administered 2016-02-29 (×2): 100 ug via INTRAVENOUS

## 2016-02-29 MED ORDER — SODIUM CHLORIDE 0.9% FLUSH
3.0000 mL | INTRAVENOUS | Status: DC | PRN
Start: 2016-02-29 — End: 2016-03-03

## 2016-02-29 MED ORDER — SIMETHICONE 80 MG PO CHEW
160.0000 mg | CHEWABLE_TABLET | Freq: Four times a day (QID) | ORAL | Status: DC | PRN
  Administered 2016-03-01: 80 mg via ORAL
  Administered 2016-03-01: 160 mg via ORAL
  Administered 2016-03-01: 80 mg via ORAL
  Administered 2016-03-01 – 2016-03-03 (×7): 160 mg via ORAL
  Filled 2016-02-29 (×10): qty 2

## 2016-02-29 MED ORDER — ACETAMINOPHEN 10 MG/ML IV SOLN
INTRAVENOUS | Status: DC | PRN
  Administered 2016-02-29: 1000 mg via INTRAVENOUS

## 2016-02-29 MED ORDER — BUPIVACAINE HCL (PF) 0.5 % IJ SOLN
INTRAMUSCULAR | Status: DC | PRN
  Administered 2016-02-29: 100 mL

## 2016-02-29 MED ORDER — OXYCODONE HCL 5 MG PO TABS
10.0000 mg | ORAL_TABLET | ORAL | Status: DC | PRN
  Administered 2016-02-29 – 2016-03-01 (×6): 10 mg via ORAL
  Administered 2016-03-01: 2 mg via ORAL
  Administered 2016-03-01 – 2016-03-03 (×10): 10 mg via ORAL
  Filled 2016-02-29 (×17): qty 2

## 2016-02-29 MED ORDER — NALOXONE HCL 2 MG/2ML IJ SOSY: 1.0000 ug/kg/h | PREFILLED_SYRINGE | INTRAMUSCULAR | Status: DC | PRN

## 2016-02-29 MED ORDER — ONDANSETRON HCL 4 MG/2ML IJ SOLN: 4.0000 mg | Freq: Three times a day (TID) | INTRAMUSCULAR | Status: DC | PRN

## 2016-02-29 MED ORDER — BUPIVACAINE LIPOSOME 1.3 % IJ SUSP
20.0000 mL | Freq: Once | INTRAMUSCULAR | Status: DC
  Filled 2016-02-29: qty 20

## 2016-02-29 MED ORDER — SCOPOLAMINE 1 MG/3DAYS TD PT72: 1.0000 | MEDICATED_PATCH | Freq: Once | TRANSDERMAL | Status: DC

## 2016-02-29 MED ORDER — DIPHENHYDRAMINE HCL 25 MG PO CAPS
25.0000 mg | ORAL_CAPSULE | ORAL | Status: DC | PRN
  Administered 2016-02-29 – 2016-03-01 (×3): 25 mg via ORAL
  Filled 2016-02-29 (×3): qty 1

## 2016-02-29 MED ORDER — DIPHENHYDRAMINE HCL 50 MG/ML IJ SOLN
INTRAMUSCULAR | Status: DC | PRN
  Administered 2016-02-29: 25 mg via INTRAVENOUS

## 2016-02-29 MED ORDER — MENTHOL 3 MG MT LOZG
1.0000 | LOZENGE | OROMUCOSAL | Status: DC | PRN
  Filled 2016-02-29: qty 9

## 2016-02-29 MED ORDER — NALBUPHINE HCL 10 MG/ML IJ SOLN
5.0000 mg | Freq: Once | INTRAMUSCULAR | Status: AC | PRN
  Administered 2016-02-29: 5 mg via INTRAVENOUS

## 2016-02-29 MED ORDER — NALBUPHINE HCL 10 MG/ML IJ SOLN: 5.0000 mg | Freq: Once | INTRAMUSCULAR | Status: AC | PRN

## 2016-02-29 MED ORDER — PRENATAL MULTIVITAMIN CH
1.0000 | ORAL_TABLET | Freq: Every day | ORAL | Status: DC
  Administered 2016-02-29 – 2016-03-03 (×4): 1 via ORAL
  Filled 2016-02-29 (×4): qty 1

## 2016-02-29 MED ORDER — SOD CITRATE-CITRIC ACID 500-334 MG/5ML PO SOLN
30.0000 mL | ORAL | Status: AC
  Administered 2016-02-29: 30 mL via ORAL
  Filled 2016-02-29: qty 30

## 2016-02-29 MED ORDER — CEFAZOLIN SODIUM-DEXTROSE 2-3 GM-% IV SOLR
2.0000 g | INTRAVENOUS | Status: AC
  Administered 2016-02-29: 2 g via INTRAVENOUS
  Filled 2016-02-29: qty 50

## 2016-02-29 MED ORDER — DIPHENHYDRAMINE HCL 50 MG/ML IJ SOLN
12.5000 mg | INTRAMUSCULAR | Status: DC | PRN
  Administered 2016-02-29 (×2): 12.5 mg via INTRAVENOUS
  Filled 2016-02-29 (×2): qty 1

## 2016-02-29 MED ORDER — CEFAZOLIN SODIUM-DEXTROSE 2-4 GM/100ML-% IV SOLN: 2.0000 g | INTRAVENOUS | Status: DC

## 2016-02-29 MED ORDER — OXYTOCIN 40 UNITS IN LACTATED RINGERS INFUSION - SIMPLE MED
2.5000 [IU]/h | INTRAVENOUS | Status: DC
  Filled 2016-02-29: qty 1000

## 2016-02-29 MED ORDER — ONDANSETRON HCL 4 MG/2ML IJ SOLN
INTRAMUSCULAR | Status: DC | PRN
  Administered 2016-02-29: 4 mg via INTRAVENOUS

## 2016-02-29 MED ORDER — FENTANYL CITRATE (PF) 100 MCG/2ML IJ SOLN: 25.0000 ug | INTRAMUSCULAR | Status: DC | PRN

## 2016-02-29 MED ORDER — SODIUM CHLORIDE 0.9 % IJ SOLN
INTRAMUSCULAR | Status: AC
  Filled 2016-02-29: qty 50

## 2016-02-29 MED ORDER — WITCH HAZEL-GLYCERIN EX PADS: 1.0000 "application " | MEDICATED_PAD | CUTANEOUS | Status: DC | PRN

## 2016-02-29 MED ORDER — BUPIVACAINE IN DEXTROSE 0.75-8.25 % IT SOLN
INTRATHECAL | Status: DC | PRN
  Administered 2016-02-29: 1.6 mL via INTRATHECAL

## 2016-02-29 MED ORDER — OXYCODONE HCL 5 MG PO TABS
5.0000 mg | ORAL_TABLET | ORAL | Status: DC | PRN
  Administered 2016-03-03: 5 mg via ORAL
  Filled 2016-02-29: qty 1

## 2016-02-29 MED ORDER — MEPERIDINE HCL 25 MG/ML IJ SOLN: 6.2500 mg | INTRAMUSCULAR | Status: DC | PRN

## 2016-02-29 MED ORDER — OXYTOCIN 40 UNITS IN LACTATED RINGERS INFUSION - SIMPLE MED
INTRAVENOUS | Status: DC | PRN
  Administered 2016-02-29: 100 mL via INTRAVENOUS
  Administered 2016-02-29: 200 mL via INTRAVENOUS
  Administered 2016-02-29: 100 mL via INTRAVENOUS
  Administered 2016-02-29: 700 mL via INTRAVENOUS

## 2016-02-29 MED ORDER — ACETAMINOPHEN 650 MG RE SUPP
650.0000 mg | Freq: Once | RECTAL | Status: DC
  Filled 2016-02-29: qty 1

## 2016-02-29 MED ORDER — FENTANYL CITRATE (PF) 100 MCG/2ML IJ SOLN
25.0000 ug | INTRAMUSCULAR | Status: DC | PRN
  Administered 2016-02-29 (×2): 25 ug via INTRAVENOUS

## 2016-02-29 MED ORDER — COCONUT OIL OIL: 1.0000 "application " | TOPICAL_OIL | Status: DC | PRN

## 2016-02-29 SURGICAL SUPPLY — 34 items
CANISTER SUCT 3000ML (MISCELLANEOUS) ×3 IMPLANT
CATH KIT ON-Q SILVERSOAK 5IN (CATHETERS) IMPLANT
CLOSURE WOUND 1/2 X4 (GAUZE/BANDAGES/DRESSINGS) ×1
DRSG TELFA 3X8 NADH (GAUZE/BANDAGES/DRESSINGS) ×3 IMPLANT
ELECT CAUTERY BLADE 6.4 (BLADE) ×3 IMPLANT
ELECT REM PT RETURN 9FT ADLT (ELECTROSURGICAL) ×3
ELECTRODE REM PT RTRN 9FT ADLT (ELECTROSURGICAL) ×1 IMPLANT
GAUZE SPONGE 4X4 12PLY STRL (GAUZE/BANDAGES/DRESSINGS) ×3 IMPLANT
GLOVE PI ORTHOPRO 6.5 (GLOVE) ×2
GLOVE PI ORTHOPRO STRL 6.5 (GLOVE) ×1 IMPLANT
GLOVE SURG SYN 6.5 ES PF (GLOVE) ×9 IMPLANT
GOWN STRL REUS W/ TWL LRG LVL3 (GOWN DISPOSABLE) ×3 IMPLANT
GOWN STRL REUS W/TWL LRG LVL3 (GOWN DISPOSABLE) ×6
LIQUID BAND (GAUZE/BANDAGES/DRESSINGS) ×3 IMPLANT
NDL SAFETY 22GX1.5 (NEEDLE) ×3 IMPLANT
NS IRRIG 1000ML POUR BTL (IV SOLUTION) ×3 IMPLANT
PACK C SECTION AR (MISCELLANEOUS) ×3 IMPLANT
PAD OB MATERNITY 4.3X12.25 (PERSONAL CARE ITEMS) ×3 IMPLANT
PAD PREP 24X41 OB/GYN DISP (PERSONAL CARE ITEMS) ×3 IMPLANT
SPONGE LAP 18X18 5 PK (GAUZE/BANDAGES/DRESSINGS) ×6 IMPLANT
STRAP SAFETY BODY (MISCELLANEOUS) ×3 IMPLANT
STRIP CLOSURE SKIN 1/2X4 (GAUZE/BANDAGES/DRESSINGS) ×2 IMPLANT
SUT MNCRL 4-0 (SUTURE) ×2
SUT MNCRL 4-0 27XMFL (SUTURE) ×1
SUT PDS AB 1 TP1 96 (SUTURE) ×3 IMPLANT
SUT VIC AB 0 CT1 36 (SUTURE) ×6 IMPLANT
SUT VIC AB 2-0 CT1 (SUTURE) ×6 IMPLANT
SUT VIC AB 2-0 CT1 27 (SUTURE) ×2
SUT VIC AB 2-0 CT1 TAPERPNT 27 (SUTURE) ×1 IMPLANT
SUT VIC AB 3-0 SH 27 (SUTURE) ×2
SUT VIC AB 3-0 SH 27X BRD (SUTURE) ×1 IMPLANT
SUTURE MNCRL 4-0 27XMF (SUTURE) ×1 IMPLANT
SWABSTK COMLB BENZOIN TINCTURE (MISCELLANEOUS) ×3 IMPLANT
SYR 30ML LL (SYRINGE) ×6 IMPLANT

## 2016-02-29 NOTE — Anesthesia Preprocedure Evaluation (Signed)
Anesthesia Evaluation  Patient identified by MRN, date of birth, ID band Patient awake    Reviewed: Allergy & Precautions, NPO status , Patient's Chart, lab work & pertinent test results  History of Anesthesia Complications Negative for: history of anesthetic complications  Airway Mallampati: II       Dental   Pulmonary Current Smoker,           Cardiovascular negative cardio ROS       Neuro/Psych Seizures - (as a child x 1),     GI/Hepatic Neg liver ROS, GERD  ,  Endo/Other  negative endocrine ROS  Renal/GU negative Renal ROS     Musculoskeletal   Abdominal   Peds  Hematology   Anesthesia Other Findings   Reproductive/Obstetrics (+) Pregnancy                             Anesthesia Physical Anesthesia Plan  ASA: II and emergent  Anesthesia Plan: Spinal   Post-op Pain Management:    Induction:   Airway Management Planned:   Additional Equipment:   Intra-op Plan:   Post-operative Plan:   Informed Consent: I have reviewed the patients History and Physical, chart, labs and discussed the procedure including the risks, benefits and alternatives for the proposed anesthesia with the patient or authorized representative who has indicated his/her understanding and acceptance.     Plan Discussed with:   Anesthesia Plan Comments:         Anesthesia Quick Evaluation

## 2016-02-29 NOTE — H&P (Addendum)
OB History & Physical   History of Present Illness:  Chief Complaint:   HPI:  Mary Wong is a 26 y.o. G3P2 female at [redacted]w[redacted]d dated by LMP c/w 19week [place].  She presents to L&D with persistent contractions.   +FM, +CTX, no LOF, no VB  Pregnancy Issues: 1. Di-di twins with discordant growth of 21% 2. Baby "A" (female) is bigger, but no longer presenting and breech 3. Baby "B" (female) is cephalic and presenting 4. Depression - on zoloft 50mg  daily 5. Anemia - on PO FeSO4 6. Smoker 7. Desires permanent sterilization -  Consent signed 12/17 8. History of preterm delivery (35w)   Maternal Medical History:   Past Medical History:  Diagnosis Date  . Medical history non-contributory   . Ovarian cyst     Past Surgical History:  Procedure Laterality Date  . NO PAST SURGERIES      No Known Allergies  Prior to Admission medications   Medication Sig Start Date End Date Taking? Authorizing Provider  acetaminophen (TYLENOL) 500 MG tablet Take 500 mg by mouth as needed.    Historical Provider, MD  dicyclomine (BENTYL) 20 MG tablet Take 1 tablet (20 mg total) by mouth 3 (three) times daily as needed for spasms. 09/04/14 09/04/15  Chinita Pester, FNP  ferrous sulfate 325 (65 FE) MG EC tablet Take 325 mg by mouth 3 (three) times daily with meals.    Historical Provider, MD  Prenatal Vit-Fe Fumarate-FA (MULTIVITAMIN-PRENATAL) 27-0.8 MG TABS tablet Take 1 tablet by mouth daily at 12 noon.    Historical Provider, MD     Prenatal care site: [facility]   Social History: She  reports that she has been smoking Cigarettes.  She has been smoking about 0.25 packs per day. She has never used smokeless tobacco. She reports that she does not drink alcohol or use drugs.  Family History: family history is not on file.   Review of Systems: A full review of systems was performed and negative except as noted in the HPI.     Physical Exam:  Vital Signs: LMP 06/27/2015  (Approximate)  General: no acute distress.  HEENT: normocephalic, atraumatic Heart: regular rate & rhythm.  No murmurs/rubs/gallops Lungs: clear to auscultation bilaterally, normal respiratory effort Abdomen: soft, gravid, non-tender;  EFW: A: 5.5lbs B 4.14lbs Pelvic:   External: Normal external female genitalia  Cervix: Dilation: 8 / Effacement (%): 70 / Station: -3    Extremities: non-tender, symmetric, 1+edema bilaterally.  DTRs: 2+ Neurologic: Alert & oriented x 3.    No results found for this or any previous visit (from the past 24 hour(s)).  Pertinent Results:  Prenatal Labs: Blood type/Rh O+  Antibody screen neg  Rubella Immune  Varicella unknown  RPR NR  HBsAg Neg  HIV NR  GC neg  Chlamydia neg  Genetic screening negative  1 hour GTT 86  3 hour GTT   GBS Not done   FHT:  Baby A (girl): 130, moderate, +accels no decels. Baby B (boy) 150, moderate, + accels no decels   TOCO: q4-21min SVE:  Dilation: 8 / Effacement (%): 70 / Station: -3    Bedside ultrasound: Baby A (girl): cephalic, baby B (boy): breech, head maternal left feet maternal right   Assessment:  Mary Wong is a 26 y.o. G3P2 female at [redacted]w[redacted]d with preterm labor, discordant di-di twins.   Plan:  1. Admit to Labor & Delivery 2. CBC, T&S, Clrs, IVF 3. GBS unknown -  will be covered with surgical ppx 4. Obtain consent. 5. Continuous efm/toco 6. Offered vaginal delivery with attempt for internal cephalic version, with risk of cesarean for baby B; she declines, elects for cesarean delivery outright.  Still wants BTL.    ----- Ranae Plumberhelsea Ward, MD Attending Obstetrician and Gynecologist [facility], Department of OB/GYN [facility]

## 2016-02-29 NOTE — Discharge Summary (Signed)
Obstetrical Discharge Summary  Patient Name: Mary Wong DOB: 1990-09-14 MRN: [id number]  Date of Admission: 02/29/2016 Date of Delivery: 03/01/15 Delivered by: Ranae Plumber Date of Discharge: 02/29/2016  Primary OB: [facility]    Gestational Age at Delivery: [redacted]w[redacted]d   Antepartum complications:  Pregnancy Issues: 1. Di-di twins with discordant growth of 21% 2. Baby "A" (female) is bigger, but no longer presenting and breech 3. Baby "B" (female) is cephalic and presenting 4. Depression - on zoloft 50mg  daily 5. Anemia - on PO FeSO4 6. Smoker 7. Desires permanent sterilization -  Consent signed 12/17 8. History of preterm delivery (35w) 9. Preterm labor (at admission) 10. Late to care (17wks)  Admitting Diagnosis: preterm labor, discordant di-di twins, malpresentation of baby B Secondary Diagnosis: Patient Active Problem List   Diagnosis Date Noted  . Preterm labor in third trimester with preterm delivery 02/29/2016  . Malpresentation of fetus, fetus 2 of multiple gestation 02/29/2016  . Discordant fetal growth in twin gestation, third trimester 02/29/2016  . Depression affecting pregnancy 02/29/2016  . Anemia affecting pregnancy 02/29/2016  . Smoking (tobacco) complicating pregnancy, third trimester 02/29/2016  . History of preterm delivery, currently pregnant 02/29/2016  . Insufficient prenatal care 02/29/2016  . Twin newborn resulting from both spontaneous ovulation and conception, delivered by cesarean in hospital 02/29/2016  . S/P cesarean section 02/29/2016  . Twin pregnancy, twins dichorionic and diamniotic 11/06/2015    Augmentation: none Complications: None Intrapartum complications/course: Mom presented with contractions, found to be 8cm with Baby A < Baby B and Baby B breech.  Proceeded to cesarean delivery. Date of Delivery: 02/28/16 Delivered By: Leeroy Bock Ward Delivery Type: primary cesarean section, low transverse incision and cesarean indication:  malpresentation: BREECH baby B Anesthesia: spinal Placenta: expressed Laceration: n/a Episiotomy: n/a Newborn Data:   Arminda, Foglio [[id number]]  Live born female  Birth Weight:  4lb 13ox [phone]) APGAR: 8, 9    Izabell, Schalk [[id number]]  Live born female  Birth Weight: 5 lb 13.5 oz (2650 g) APGAR: 7, 9   Postpartum Procedures: none fE  Post partum course: stable  Patient had an uncomplicated postpartum course.  By time of discharge on POD#3, her pain was controlled on oral pain medications; she had appropriate lochia and was ambulating, voiding without difficulty, tolerating regular diet and passing flatus.  She was deemed stable for discharge to home.    Discharge Physical Exam: stable  BP 108/69 (BP Location: Left Arm)   Pulse 94   Temp 98 F (36.7 C) (Oral)   Resp 18   Ht 5\' 9"  (1.753 m)   Wt 72.1 kg (159 lb)   LMP 06/27/2015 (Approximate)   SpO2 94%   Breastfeeding? Unknown   BMI 23.48 kg/m   General: NAD CV: RRR Pulm: CTABL, nl effort ABD: s/nd/nt, fundus firm and below the umbilicus Lochia: moderate Incision: c/d/i, covered in surgical glue DVT Evaluation: LE non-ttp, no evidence of DVT on exam.  Hemoglobin  Date Value Ref Range Status  02/29/2016 10.1 (L) 12.0 - 16.0 g/dL Final   HGB  Date Value Ref Range Status  04/16/2014 12.3 12.0 - 16.0 g/dL Final   HCT  Date Value Ref Range Status  02/29/2016 30.0 (L) 35.0 - 47.0 % Final  04/16/2014 37.7 35.0 - 47.0 % Final     Disposition: stable, discharge to home. Baby Feeding: formula Baby Disposition: NICU admission for both due to prematurity  Rh Immune globulin given: n/a Rubella vaccine given: n/a Tdap  vaccine given in AP or PP setting: declined Flu vaccine given in AP or PP setting: AP  Contraception: BTL  Prenatal Labs:  Blood type/Rh O+  Antibody screen neg  Rubella Immune  Varicella unknown  RPR NR  HBsAg Neg  HIV NR  GC neg  Chlamydia neg  Genetic screening negative   1 hour GTT 86  3 hour GTT   GBS Not done      Plan:  Mary Wong was discharged to home in good condition. [facility]   Discharge Medications: Norco and simethicone and Fe 325 mg po daily    Signed:  Sharee Pimplearon W. Jones, MSN, CNM, FNP

## 2016-02-29 NOTE — Anesthesia Postprocedure Evaluation (Signed)
Anesthesia Post Note  Patient: Mary RheinCarissica F Wong  Procedure(s) Performed: Procedure(s) (LRB): CESAREAN SECTION WITH BILATERAL TUBAL LIGATION (N/A)  Patient location during evaluation: Mother Baby Anesthesia Type: Spinal Level of consciousness: awake, awake and alert and oriented Pain management: pain level controlled Vital Signs Assessment: post-procedure vital signs reviewed and stable Respiratory status: spontaneous breathing and nonlabored ventilation Cardiovascular status: stable Postop Assessment: no headache, no backache, patient able to bend at knees, spinal receding and adequate PO intake Anesthetic complications: no     Last Vitals:  Vitals:   02/29/16 0757 02/29/16 1000  BP:  122/73  Pulse: 84 99  Resp: 19 18  Temp:  37.1 C    Last Pain:  Vitals:   02/29/16 1005  TempSrc:   PainSc: 6                  Zachary GeorgeWeatherly,  Detron Carras F

## 2016-02-29 NOTE — Procedures (Signed)
Bedside ultrasound perfomed;  Baby A (girl) cephalic with back to maternal right Baby B (boy) footling breech with head to maternal right, back/feet to maternal left.   ----- Ranae Plumberhelsea Jaleyah Longhi, MD Attending Obstetrician and Gynecologist [facility], Department of OB[facility]GYN [facility]

## 2016-02-29 NOTE — Anesthesia Procedure Notes (Signed)
Date/Time: 02/29/2016 4:23 AM Performed by: Ginger CarneMICHELET, Tishawn Friedhoff Pre-anesthesia Checklist: Patient identified, Emergency Drugs available, Suction available, Patient being monitored and Timeout performed Patient Re-evaluated:Patient Re-evaluated prior to inductionOxygen Delivery Method: Nasal cannula

## 2016-02-29 NOTE — Anesthesia Post-op Follow-up Note (Cosign Needed)
Anesthesia QCDR form completed.        

## 2016-02-29 NOTE — Anesthesia Post-op Follow-up Note (Signed)
  Anesthesia Pain Follow-up Note  Patient: Mary Wong  Day #: 1  Date of Follow-up: 02/29/2016 Time: 10:54 AM  Last Vitals:  Vitals:   02/29/16 0757 02/29/16 1000  BP:  122/73  Pulse: 84 99  Resp: 19 18  Temp:  37.1 C    Level of Consciousness: alert  Pain: mild   Side Effects:None  Catheter Site Exam:clean, dry, no drainage     Plan: D/C from anesthesia care at surgeon's request  Zachary GeorgeWeatherly,  Takari Lundahl F

## 2016-02-29 NOTE — Transfer of Care (Signed)
Immediate Anesthesia Transfer of Care Note  Patient: Mary Wong  Procedure(s) Performed: Procedure(s): CESAREAN SECTION WITH BILATERAL TUBAL LIGATION (N/A)  Patient Location: PACU  Anesthesia Type:Spinal  Level of Consciousness: awake, alert  and oriented  Airway & Oxygen Therapy: Patient Spontanous Breathing  Post-op Assessment: Report given to RN and Post -op Vital signs reviewed and stable  Post vital signs: Reviewed and stable  Last Vitals:  Vitals:   02/29/16 0617  BP: (!) 134/115  Pulse: 80  Resp: 14  Temp: 36.6 C    Last Pain:  Vitals:   02/29/16 0617  TempSrc: Tympanic  PainSc:          Complications: No apparent anesthesia complications

## 2016-02-29 NOTE — Op Note (Signed)
Cesarean Section Procedure Note  02/29/2016  Patient:  Mary Wong  26 y.o. female at 3868w6d.  Patient's last menstrual period was 06/27/2015 (approximate). Preoperative diagnosis:  pre- term labor, discordant Di-Di twins, Baby B breech, desire for sterilization Postoperative diagnosis:  pre-term labor, discordant Di-Di twins, baby B breech, desire for sterilization  PROCEDURE:  Procedure(s): CESAREAN SECTION WITH BILATERAL TUBAL LIGATION (N/A) BILATERAL TUBAL LIGATION  Surgeon:  Surgeon(s) and Role:    * Celester Morgan C Deron Poole, MD - Primary Anesthesia:  spinal I/O: Total I/O In: 1000 [I.V.:1000] Out: 780 [Urine:30; Blood:750] Specimens:  Cord Blood, placenta portion of right tube with fimbriated end, portion of left tube with fimbriated end Complications: None Apparent Disposition:  VS stable to PACU  Findings: normal uterus, tubes and ovaries bilaterally.  Baby B cord thick. Baby B was brought to NICU requiring O2 therapy soon after delivery.    Katrinka BlazingSmith, Girl Ellinor [[id number]][[id number]]  Live born female  Birth Weight:  4lb 13oz (2185 g) APGAR: 8,9    Oretha MilchSmith, BoyB Quenisha [[id number]][[id number]]  Live born female  Birth Weight: 5 lb 13.5 oz (2650 g) APGAR: 7, 9    Indication for procedure: 26 y.o. female at 4068w6d presented to L&D with persistent contractions, found to be 8cm, with Baby A (girl) presenting cephalic and Baby B (boy) breech.  Baby B was estimated to be 1lb larger than Baby A, thus breech extraction was not offered.  Patient elected for cesarean for both twins and bilateral tubal ligation for permanent sterility.   Procedure Details   The risks, benefits, complications, treatment options, and expected outcomes were discussed with the patient. Informed consent was obtained. The patient was taken to Operating Room, identified as Mary Wong and the procedure verified as a cesarean delivery with bilateral tubal ligation.   After administration of anesthesia, the patient was prepped  and draped in the usual sterile manner. A surgical time out was performed, with the pediatric team present. After confirming adequate anesthesia, a Pfannenstiel incision was made and carried down through the subcutaneous tissue to the fascia. Fascial incision was made and extended transversely. The fascia was separated from the underlying rectus tissue superiorly and inferiorly. The peritoneum was identified and entered. Peritoneal incision was extended longitudinally.  A low transverse uterine incision was made. Delivered from cephalic presentation was a live born female. She was vigorous, so delayed cord clamping was performed for 30 seconds. The umbilical cord was doubly clamped and cut, and the baby was handed off to the awaitng pediatrician.  The feet of Baby B were grasped and an amniotomy was made, and using traditional maneuvers, was delivered breech. He was flacid and not responsive to stimuli, so the umbilical cord was immediately doubly clamped and cut, and the baby was handed off to the awaitng pediatrician. Blood from each cord was obtained for evaluation. The placentas were conjoined, and removed intact and appeared normal. The uterus was delivered from the abdominal cavity and cleared of clots, membranes, and debris. The uterus, tubes and ovaries appeared normal. The uterine incision was closed with running locking sutures of 0 Vicryl, and then a second, imbricating stitch was placed. Hemostasis was observed.   The attention was turned to the bilateral tubes, which were divided by placing a kelly clamp in the mesosalpinx, a Haney stitch placed and tied down in a step-wise fashion. The tubes were removed from the fimbriated ends to the cornua. These sites were hemostatic.  The abdominal cavity was evacuated of extraneous fluid.  The uterus was returned to the abdominal cavity and again the incision was inspected for hemostasis, which was confirmed.  The paracolic gutters were cleaned. The fascia  was then reapproximated with running suture of vicryl. 60cc of Long- and short-acting bupivicaine was injected circumferentially into the fascia.  After a change of gloves, the subcutaneous tissue was irrigated and reapproximated with 3-0 vicryl. The skin was closed with 4-0 Monocryl and 40cc of long- and short-acting bupivacaine injected into the skin and subcutaneous tissues.  The incision was covered with surgical glue.     Instrument, sponge, and needle counts were correct prior the abdominal closure and at the conclusion of the case.   I was present and performed this procedure in its entirety.  ----- Ranae Plumber, MD Attending Obstetrician and Gynecologist [facility], Department of OB/GYN [facility]

## 2016-02-29 NOTE — Anesthesia Procedure Notes (Signed)
Spinal  Patient location during procedure: OR Start time: 02/29/2016 4:20 AM End time: 02/29/2016 4:23 AM Staffing Anesthesiologist: Gunnar Fusi Resident/CRNA: Johnna Acosta Performed: resident/CRNA  Preanesthetic Checklist Completed: patient identified, site marked, surgical consent, pre-op evaluation, timeout performed, IV checked, risks and benefits discussed and monitors and equipment checked Spinal Block Patient position: sitting Prep: Betadine Patient monitoring: heart rate, continuous pulse ox, blood pressure and cardiac monitor Approach: midline Location: L4-5 Injection technique: single-shot Needle Needle type: Whitacre and Introducer  Needle gauge: 24 G Needle length: 9 cm Additional Notes Negative paresthesia. Negative blood return. Positive free-flowing CSF. Expiration date of kit checked and confirmed. Patient tolerated procedure well, without complications.

## 2016-03-01 ENCOUNTER — Encounter: Payer: Self-pay | Admitting: Obstetrics & Gynecology

## 2016-03-01 LAB — CBC
HCT: 23.9 % — ABNORMAL LOW (ref 35.0–47.0)
HEMOGLOBIN: 7.9 g/dL — AB (ref 12.0–16.0)
MCH: 28.3 pg (ref 26.0–34.0)
MCHC: 33.2 g/dL (ref 32.0–36.0)
MCV: 85.3 fL (ref 80.0–100.0)
Platelets: 114 10*3/uL — ABNORMAL LOW (ref 150–440)
RBC: 2.8 MIL/uL — ABNORMAL LOW (ref 3.80–5.20)
RDW: 16.6 % — ABNORMAL HIGH (ref 11.5–14.5)
WBC: 13.9 10*3/uL — ABNORMAL HIGH (ref 3.6–11.0)

## 2016-03-01 LAB — RPR: RPR: NONREACTIVE

## 2016-03-02 LAB — SURGICAL PATHOLOGY

## 2016-03-02 MED ORDER — FERROUS SULFATE 325 (65 FE) MG PO TABS
325.0000 mg | ORAL_TABLET | Freq: Three times a day (TID) | ORAL | Status: DC
  Administered 2016-03-02 – 2016-03-03 (×4): 325 mg via ORAL
  Filled 2016-03-02 (×4): qty 1

## 2016-03-02 NOTE — Progress Notes (Signed)
.   POSTOPERATIVE DAY # 2 S/P Primary LTCS and BTL    S:         Reports feeling good, but sore and having flatus pain             Tolerating po intake / no nausea / no vomiting / + flatus / no BM             Bleeding is light             Pain controlled with Motrin and Percocet             Up ad lib / ambulatory/ voiding QS  Newborns are in NICU - formula feeding   O:  VS: BP (!) 104/55 (BP Location: Right Arm)   Pulse 85   Temp 98.3 F (36.8 C) (Oral)   Resp 18   Ht 5\' 9"  (1.753 m)   Wt 72.1 kg (159 lb)   LMP 06/27/2015 (Approximate)   SpO2 98%   Breastfeeding? Unknown   BMI 23.48 kg/m    LABS:               Recent Labs  02/29/16 0358 03/01/16 0503  WBC 18.2* 13.9*  HGB 10.1* 7.9*  PLT 178 114*               Bloodtype: --/--/O POS (02/25 0355)  Rubella: Immune (10/24 0000)                                             I&O: Intake/Output      02/26 0701 - 02/27 0700 02/27 0701 - 02/28 0700   I.V. (mL/kg)     Total Intake(mL/kg)     Urine (mL/kg/hr)     Total Output       Net                         Physical Exam:             Alert and Oriented X3  Lungs: Clear and unlabored  Heart: regular rate and rhythm / no mumurs  Abdomen: soft, non-tender, non-distended, bowel sounds present in all 4 quadrants             Fundus: firm, non-tender, U-1             Dressing: c/d/i              Incision:  approximated with sutures / no erythema / no ecchymosis / no drainage  Perineum: intact  Lochia: appropriate, no clots  Extremities: no edema, no calf pain or tenderness  A:        POD # 2 S/P Primary LTCS and BTL             ABL Anemia - VSS   P:        Routine postoperative care              Begin oral FE TID   Continue expected management  Anticipate discharge home tomorrow  Carlean JewsMeredith Ciela Mahajan, CNM

## 2016-03-02 NOTE — Clinical Social Work Maternal (Signed)
  CLINICAL SOCIAL WORK MATERNAL/CHILD NOTE  Patient Details  Name: Mary Wong MRN: [id number] Date of Birth: 11/27/1990  Date:  03/02/2016  Clinical Social Worker Initiating Note:  York SpanielMonica Yecenia Dalgleish MSW,LCSW Date/ Time Initiated:  03/02/16/      Child's Name:      Legal Guardian:  Mother   Need for Interpreter:  None   Date of Referral:        Reason for Referral:   (NICU)   Referral Source:  NICU   Address:     Phone number:      Household Members:  Spouse, Minor Children   Natural Supports (not living in the home):  Extended Family   Professional Supports: None   Employment:     Type of Work:     Education:      Architectinancial Resources:  [employer]   Other Resources:  [employer]   Cultural/Religious Considerations Which May Impact Care:  none  Strengths:  Ability to meet basic needs , Compliance with medical plan , Home prepared for child    Risk Factors/Current Problems:  None   Cognitive State:  Alert , Able to Concentrate    Mood/Affect:  Calm , Happy , Relaxed    CSW Assessment: CSW spoke with patient and her significant other this afternoon. CSW introduced self and explained role and purpose of visit. Patient and significant other were pleasant and cooperative and both were very happy when talking about their twins. They state that they have two other children: 5 and 3 and that they are all in the home together. They stated that they have all necessities but are concerned about the car seats. We discussed waiting closer to discharge of the twins in order to determine the most appropriate car seat. The father of the twins stated that he was going to go to the [employer] when time to see if they had any car seats. CSW discussed postpartum depression and signs and symptoms. Patient did admit to having periods of crying spells through her pregnancy and is aware if they continue to reach out to family or a professional for assistance. She stated that she is not on  medications for depression at this time. Patient has supportive family and friends. Parents are aware that if they have any questions or concerns that they can reach me. CSW will continue to follow.   CSW Plan/Description:       York SpanielMonica Anothy Bufano, LCSW 03/02/2016, 4:06 PM

## 2016-03-03 MED ORDER — SIMETHICONE 80 MG PO CHEW: 80.0000 mg | CHEWABLE_TABLET | Freq: Four times a day (QID) | ORAL | 0 refills | Status: DC | PRN

## 2016-03-03 MED ORDER — OXYCODONE HCL 5 MG PO TABS: 5.0000 mg | ORAL_TABLET | ORAL | 0 refills | Status: DC | PRN

## 2016-03-03 MED ORDER — TETANUS-DIPHTH-ACELL PERTUSSIS 5-2.5-18.5 LF-MCG/0.5 IM SUSP
0.5000 mL | Freq: Once | INTRAMUSCULAR | Status: AC
  Administered 2016-03-03: 0.5 mL via INTRAMUSCULAR
  Filled 2016-03-03: qty 0.5

## 2016-03-03 NOTE — Progress Notes (Addendum)
Post Partum Day 3/POD#3 lTCS/TL Twins pre-term Subjective: Gas pills are helping  Objective: Blood pressure 109/76, pulse 87, temperature 97.9 F (36.6 C), temperature source Oral, resp. rate 20, height 5\' 9"  (1.753 m), weight 72.1 kg (159 lb), last menstrual period 06/27/2015, SpO2 100 %, unknown if currently breastfeeding.  Physical Exam:  General: A,A&O x 3  Heart:S1S2, RRR, No M/R/G Lungs: CTA bilat, no W/R/R. Lochia: Mod, no clots Uterine Fundus: at umbilicus Incision: C/D/I DVT Evaluation: Neg Homans   Recent Labs  03/01/16 0503  HGB 7.9*  HCT 23.9*    Assessment/Plan: POD#3 P: DC home  2. FU at 2 weeks pp 3. Norco Rx and simethicone at home 4. OTC Fe   LOS: 3 days   Sharee PimpleCaron W Hisako Bugh 03/03/2016, 8:27 AM

## 2016-03-03 NOTE — Progress Notes (Signed)
D/C home to car via auxiliary in wheelchair.  

## 2016-03-03 NOTE — Progress Notes (Signed)
D/C instructions provided, pt states understanding, aware of follow up appt. Prescription given to pt. Pt states will spend time with infants in nursery and await ride.

## 2016-03-03 NOTE — Discharge Instructions (Signed)
Cesarean Delivery, Care After °Refer to this sheet in the next few weeks. These instructions provide you with information about caring for yourself after your procedure. Your health care provider may also give you more specific instructions. Your treatment has been planned according to current medical practices, but problems sometimes occur. Call your health care provider if you have any problems or questions after your procedure. °What can I expect after the procedure? °After the procedure, it is common to have: °· A small amount of blood or clear fluid coming from the incision. °· Some redness, swelling, and pain in your incision area. °· Some abdominal pain and soreness. °· Vaginal bleeding (lochia). °· Pelvic cramps. °· Fatigue. °Follow these instructions at home: °Incision care  ° °· Follow instructions from your health care provider about how to take care of your incision. Make sure you: °¨ Wash your hands with soap and water before you change your bandage (dressing). If soap and water are not available, use hand sanitizer. °¨ Change your dressing as told by your health care provider. °¨ Leave stitches (sutures), skin staples, skin glue, or adhesive strips in place. These skin closures may need to stay in place for 2 weeks or longer. If adhesive strip edges start to loosen and curl up, you may trim the loose edges. Do not remove adhesive strips completely unless your health care provider tells you to do that. °· Check your incision area every day for signs of infection. Check for: °¨ More redness, swelling, or pain. °¨ More fluid or blood. °¨ Warmth. °¨ Pus or a bad smell. °· When you cough or sneeze, hug a pillow. This helps with pain and decreases the chance of your incision opening up (dehiscing). Do this until your incision heals. °Medicines  °· Take over-the-counter and prescription medicines only as told by your health care provider. °· If you were prescribed an antibiotic medicine, take it as told by  your health care provider. Do not stop taking the antibiotic until it is finished. °Driving  °· Do not drive or operate heavy machinery while taking prescription pain medicine. °· Do not drive for 24 hours if you received a sedative. °Lifestyle  °· Do not drink alcohol. This is especially important if you are breastfeeding or taking pain medicine. °· Do not use tobacco products, including cigarettes, chewing tobacco, or e-cigarettes. If you need help quitting, ask your health care provider. Tobacco can delay wound healing. °Eating and drinking  °· Drink at least 8 eight-ounce glasses of water every day unless told not to by your health care provider. If you breastfeed, you may need to drink more water than this. °· Eat high-fiber foods every day. These foods may help prevent or relieve constipation. High-fiber foods include: °¨ Whole grain cereals and breads. °¨ Brown rice. °¨ Beans. °¨ Fresh fruits and vegetables. °Activity  °· Return to your normal activities as told by your health care provider. Ask your health care provider what activities are safe for you. °· Rest as much as possible. Try to rest or take a nap while your baby is sleeping. °· Do not lift anything that is heavier than your baby or 10 lb (4.5 kg) as told by your health care provider. °· Ask your health care provider when you can engage in sexual activity. This may depend on your: °¨ Risk of infection. °¨ Healing rate. °¨ Comfort and desire to engage in sexual activity. °Bathing  °· Do not take baths, swim, or use a   hot tub until your health care provider approves. Ask your health care provider if you can take showers. You may only be allowed to take sponge baths until your incision heals.  Keep your dressing dry as told by your health care provider. General instructions   Do not use tampons or douches until your health care provider approves.  Wear:  Loose, comfortable clothing.  A supportive and well-fitting bra.  Watch for any blood  clots that may pass from your vagina. These may look like clumps of dark red, brown, or black discharge.  Keep your perineum clean and dry as told by your health care provider.  Wipe from front to back when you use the toilet.  If possible, have someone help you care for your baby and help with household activities for a few days after you leave the hospital.  Keep all follow-up visits for you and your baby as told by your health care provider. This is important. Contact a health care provider if:  You have:  Bad-smelling vaginal discharge.  Difficulty urinating.  Pain when urinating.  A sudden increase or decrease in the frequency of your bowel movements.  More redness, swelling, or pain around your incision.  More fluid or blood coming from your incision.  Pus or a bad smell coming from your incision.  A fever.  A rash.  Little or no interest in activities you used to enjoy.  Questions about caring for yourself or your baby.  Nausea.  Your incision feels warm to the touch.  Your breasts turn red or become painful or hard.  You feel unusually sad or worried.  You vomit.  You pass large blood clots from your vagina. If you pass a blood clot, save it to show to your health care provider. Do not flush blood clots down the toilet without showing your health care provider.  You urinate more than usual.  You are dizzy or light-headed.  You have not breastfed and have not had a menstrual period for 12 weeks after delivery.  You stopped breastfeeding and have not had a menstrual period for 12 weeks after stopping breastfeeding. Get help right away if:  You have:  Pain that does not go away or get better with medicine.  Chest pain.  Difficulty breathing.  Blurred vision or spots in your vision.  Thoughts about hurting yourself or your baby.  New pain in your abdomen or in one of your legs.  A severe headache.  You faint.  You bleed from your vagina so  much that you fill two sanitary pads in one hour. This information is not intended to replace advice given to you by your health care provider. Make sure you discuss any questions you have with your health care provider. Document Released: 09/12/2001 Document Revised: 05/01/2015 Document Reviewed: 11/25/2014 Elsevier Interactive Patient Education  2017 Elsevier Inc. Anemia, Nonspecific Anemia is a condition in which the concentration of red blood cells or hemoglobin in the blood is below normal. Hemoglobin is a substance in red blood cells that carries oxygen to the tissues of the body. Anemia results in not enough oxygen reaching these tissues. What are the causes? Common causes of anemia include:  Excessive bleeding. Bleeding may be internal or external. This includes excessive bleeding from periods (in women) or from the intestine.  Poor nutrition.  Chronic kidney, thyroid, and liver disease.  Bone marrow disorders that decrease red blood cell production.  Cancer and treatments for cancer.  HIV, AIDS, and  their treatments.  Spleen problems that increase red blood cell destruction.  Blood disorders.  Excess destruction of red blood cells due to infection, medicines, and autoimmune disorders. What are the signs or symptoms?  Minor weakness.  Dizziness.  Headache.  Palpitations.  Shortness of breath, especially with exercise.  Paleness.  Cold sensitivity.  Indigestion.  Nausea.  Difficulty sleeping.  Difficulty concentrating. Symptoms may occur suddenly or they may develop slowly. How is this diagnosed? Additional blood tests are often needed. These help your health care provider determine the best treatment. Your health care provider will check your stool for blood and look for other causes of blood loss. How is this treated? Treatment varies depending on the cause of the anemia. Treatment can include:  Supplements of iron, vitamin B12, or folic  acid.  Hormone medicines.  A blood transfusion. This may be needed if blood loss is severe.  Hospitalization. This may be needed if there is significant continual blood loss.  Dietary changes.  Spleen removal. Follow these instructions at home: Keep all follow-up appointments. It often takes many weeks to correct anemia, and having your health care provider check on your condition and your response to treatment is very important. Get help right away if:  You develop extreme weakness, shortness of breath, or chest pain.  You become dizzy or have trouble concentrating.  You develop heavy vaginal bleeding.  You develop a rash.  You have bloody or black, tarry stools.  You faint.  You vomit up blood.  You vomit repeatedly.  You have abdominal pain.  You have a fever or persistent symptoms for more than 2-3 days.  You have a fever and your symptoms suddenly get worse.  You are dehydrated. This information is not intended to replace advice given to you by your health care provider. Make sure you discuss any questions you have with your health care provider. Document Released: 01/29/2004 Document Revised: 06/04/2015 Document Reviewed: 06/16/2012 Elsevier Interactive Patient Education  2017 [employer]

## 2016-05-02 IMAGING — CT CT ABD-PELV W/ CM
1 of 2 series · 15 of 32 positions shown, 19 images · IV contrast (omnipaque)
Comparison: Pelvis ultrasound [DATE] and earlier, CT Abdomen and
Pelvis 04/16/2014

CLINICAL DATA: 24-year-old female with recurrent left side
abdominal pain and bloating since [place]. Left ovarian cyst. Initial
encounter.

EXAM:
CT ABDOMEN AND PELVIS WITH CONTRAST
TECHNIQUE: Multidetector CT imaging of the abdomen and pelvis was performed
using the standard protocol following bolus administration of
intravenous contrast.
CONTRAST:  100 mL Omnipaque 350

[Series 2: routine abd pel with · axial · 0.70mm/px · z∈[+384,+794]mm · 15 of 90 slices shown, 19 images]
[im 4/90  soft-tissue]
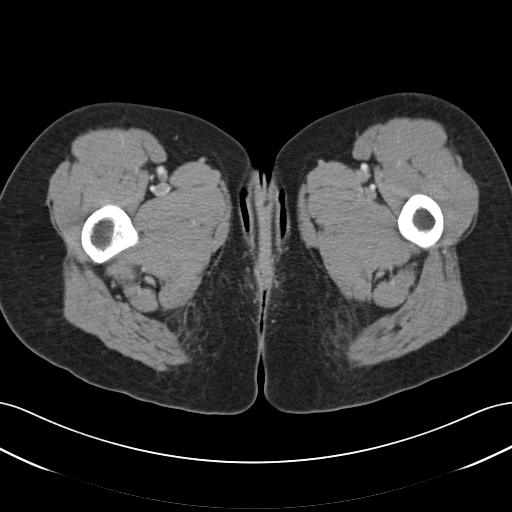
[im 4/90  bone]
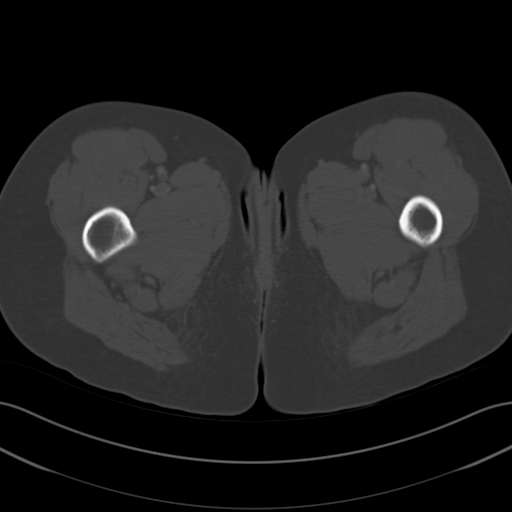
[im 11/90  soft-tissue]
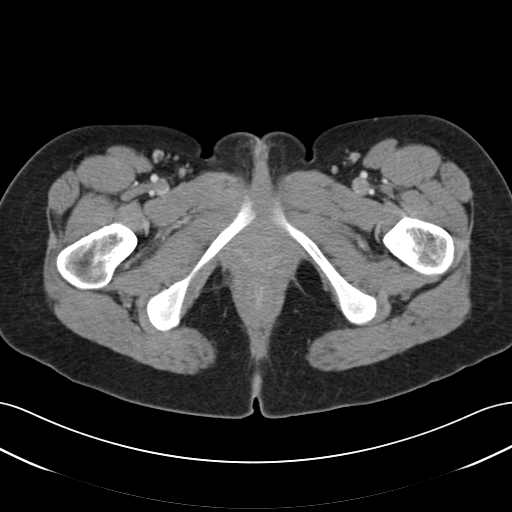
[im 18/90  soft-tissue]
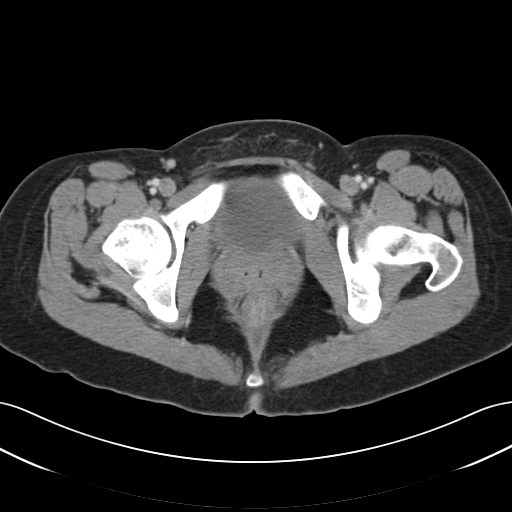
[im 24/90  soft-tissue]
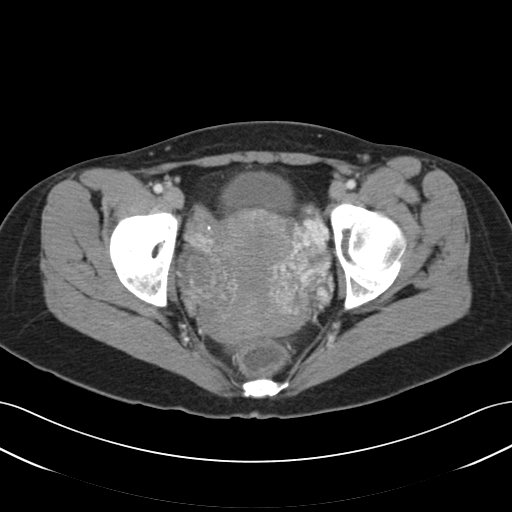
[im 31/90  soft-tissue]
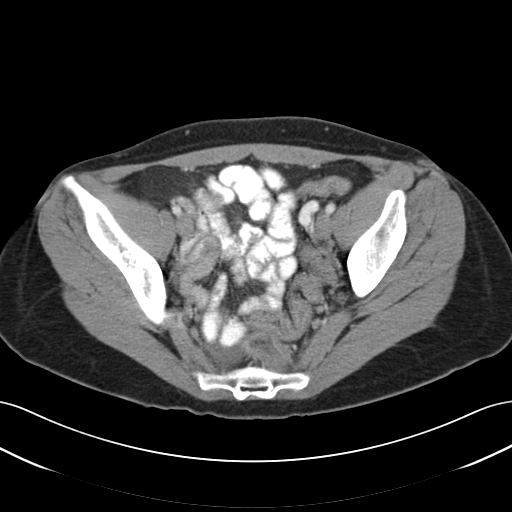
[im 38/90  soft-tissue]
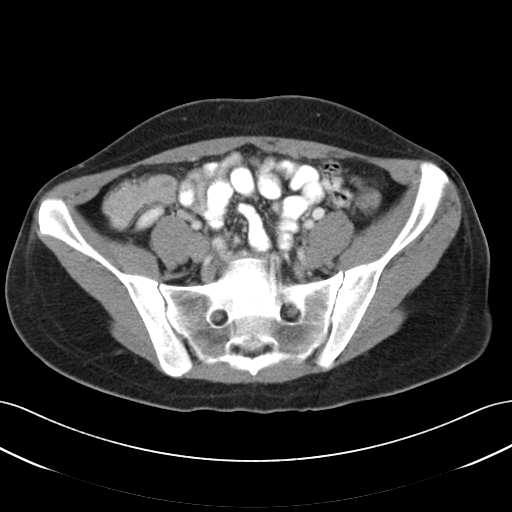
[im 45/90  soft-tissue]
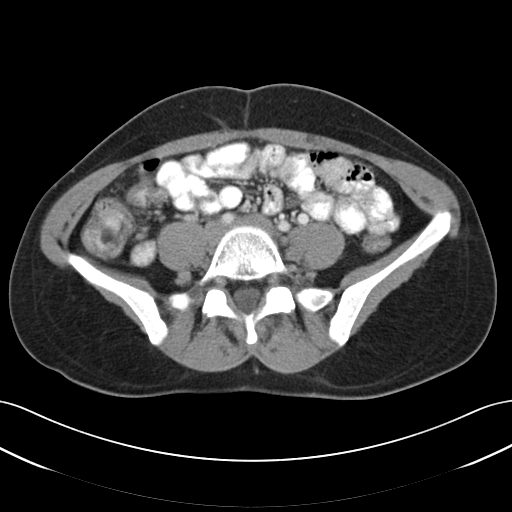
[im 52/90  soft-tissue]
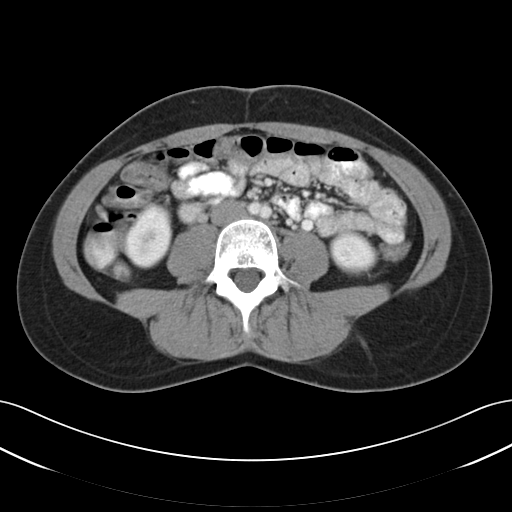
[im 59/90  soft-tissue]
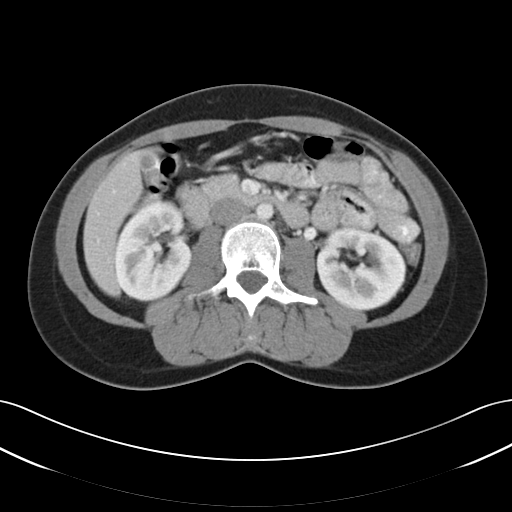
[im 59/90  bone]
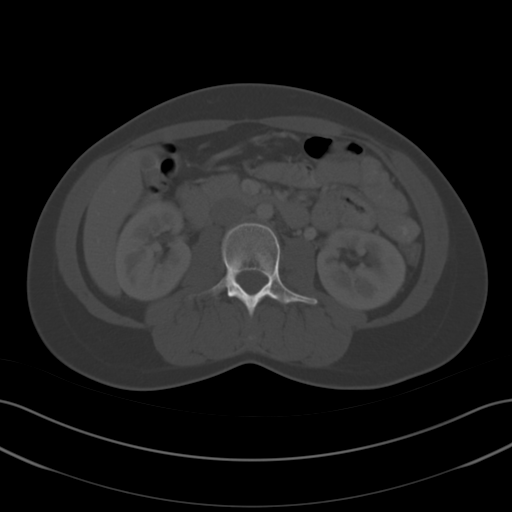
[im 66/90  soft-tissue]
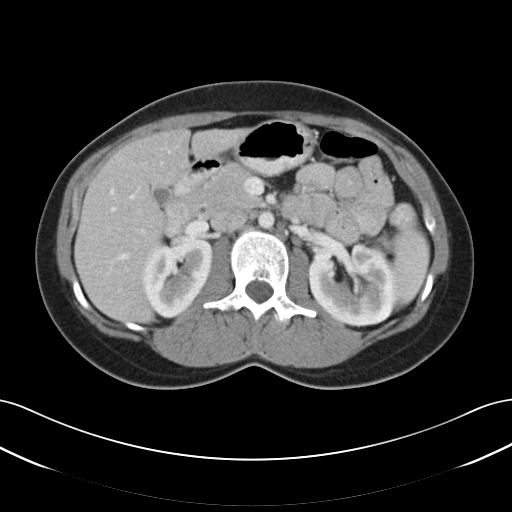
[im 72/90  soft-tissue]
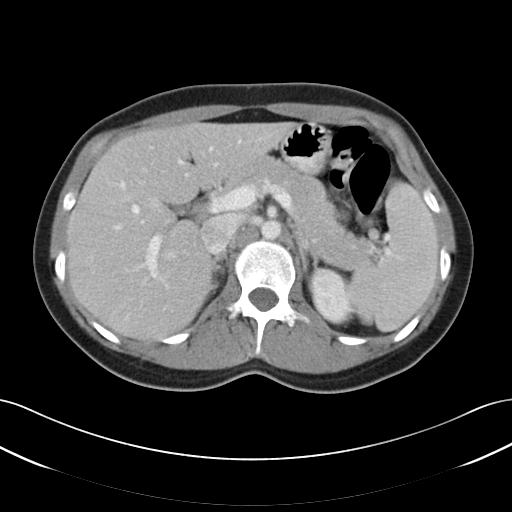
[im 76/90  lung]
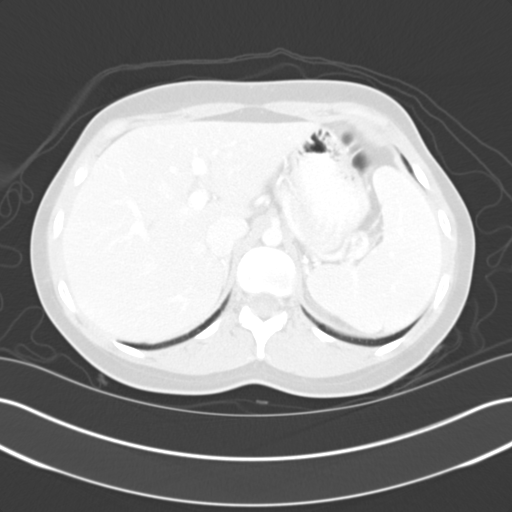
[im 79/90  soft-tissue]
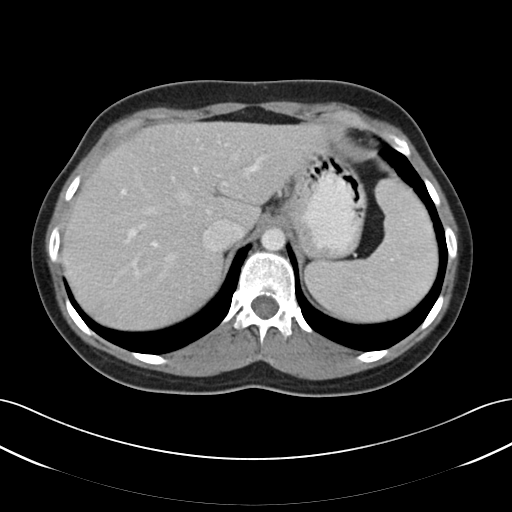
[im 79/90  lung]
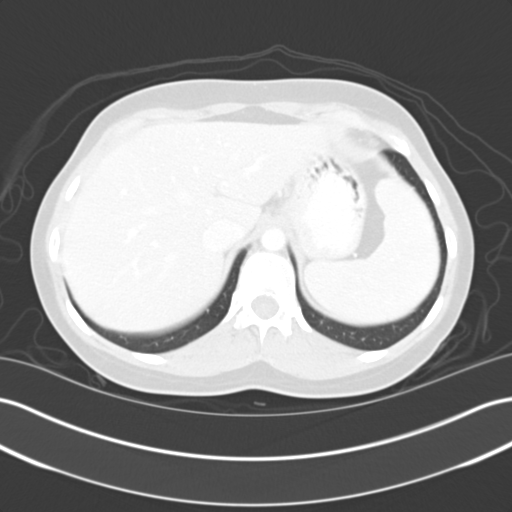
[im 83/90  lung]
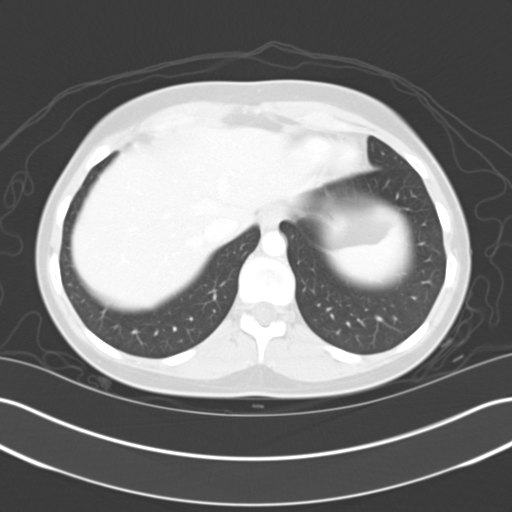
[im 86/90  soft-tissue]
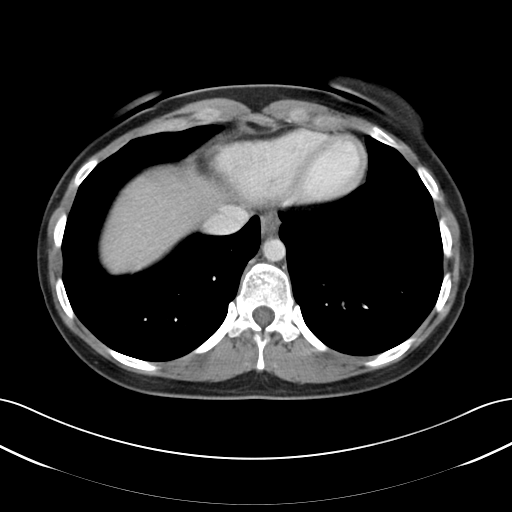
[im 86/90  lung]
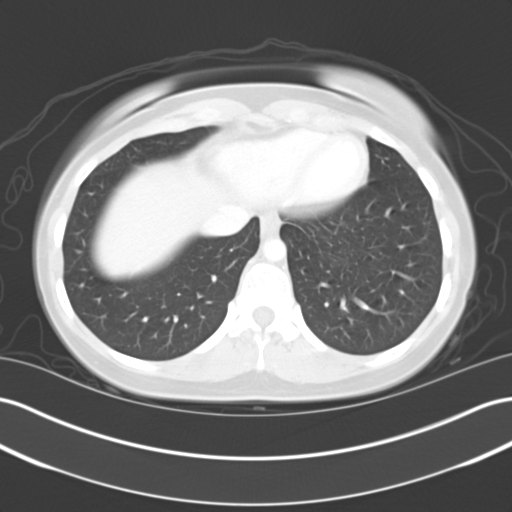

[15 of 32 positions shown; findings below may reference images not displayed]

FINDINGS: Negative lung bases. No pericardial or pleural effusion. Mild
scoliosis. Otherwise negative osseous structures.

5.3 cm left ovarian cyst seen in [place] has resolved. There is a
small 2.7 cm cyst of the right ovary which probably corresponds to
that demonstrated on [DATE]. Small volume of pelvic free fluid in
the cul-de-sac. Prominent gonadal vein and parametrial venous
opacification greater on the left. Negative uterus.

Negative bladder. Decompressed rectum. Redundant but decompressed
and otherwise negative sigmoid colon. Decompressed left colon colon.
Transverse and right colon also largely decompressed. Negative
terminal ileum. Normal appendix best seen on coronal image 47. No
dilated small bowel. Negative stomach and duodenum.

Mildly decreased density in the liver compatible with a degree of
steatosis. Gallbladder, spleen, pancreas and adrenal glands are
within normal limits. Portal venous system is patent. No abdominal
free fluid. Major arterial structures are patent. No
lymphadenopathy. Both kidneys appear stable and within normal
limits.
IMPRESSION: 1. Small volume of probably physiologic free fluid in the pelvis.
The 5 cm left adnexal cyst has resolved since [place]. There is a
small 2.7 cm right adnexal cyst which is also likely physiologic and
probably corresponds to that on the 08/23/2014 ultrasound.
2. There is left greater than right gonadal vein and parametrial
venous enhancement. Correlate for symptoms of pelvic congestion
syndrome.
3. Negative CT abdomen.

## 2016-10-22 ENCOUNTER — Encounter: Payer: Self-pay | Admitting: Emergency Medicine

## 2016-10-22 ENCOUNTER — Emergency Department
Admission: EM | Admit: 2016-10-22 | Discharge: 2016-10-22 | Discharge status: Against Medical Advice | Payer: Medicaid Other | Attending: Emergency Medicine | Admitting: Emergency Medicine

## 2016-10-22 DIAGNOSIS — K649 Unspecified hemorrhoids: Secondary | ICD-10-CM | POA: Insufficient documentation

## 2016-10-22 DIAGNOSIS — Z5321 Procedure and treatment not carried out due to patient leaving prior to being seen by health care provider: Secondary | ICD-10-CM | POA: Diagnosis not present

## 2016-10-22 NOTE — ED Notes (Signed)
Reports pain with bowel movements, this started 3 days ago with cramping in abdomen, states "I feel likemy left leg feels like it's out of socket"

## 2016-10-22 NOTE — ED Notes (Signed)
Patient gave birth to twins about 7 months ago, and believes she has hemorrhoids today as she reports "Butt Pain". She states no blood found when wiping.

## 2016-10-22 NOTE — ED Notes (Signed)
Rechecked room, pt continues to not be in room, not seen in bathroom or hallways either. PA notified that patient eloped.

## 2016-10-22 NOTE — ED Notes (Signed)
This RN and PA to room at this time, pt found to not be in room. Pt not seen in bathroom at this time.

## 2016-10-22 NOTE — ED Triage Notes (Signed)
Pt reports rectal pain for three days,  delivered twiins about 7 mths ago. States it feels like hemorrhoids.

## 2017-01-29 ENCOUNTER — Other Ambulatory Visit: Payer: Self-pay

## 2017-01-29 ENCOUNTER — Emergency Department
Admission: EM | Admit: 2017-01-29 | Discharge: 2017-01-29 | Disposition: A | Discharge status: Home / Self Care | Payer: Medicaid Other | Attending: Emergency Medicine | Admitting: Emergency Medicine

## 2017-01-29 DIAGNOSIS — K6289 Other specified diseases of anus and rectum: Secondary | ICD-10-CM | POA: Diagnosis present

## 2017-01-29 DIAGNOSIS — K642 Third degree hemorrhoids: Secondary | ICD-10-CM | POA: Diagnosis not present

## 2017-01-29 DIAGNOSIS — Z79899 Other long term (current) drug therapy: Secondary | ICD-10-CM | POA: Diagnosis not present

## 2017-01-29 DIAGNOSIS — F17210 Nicotine dependence, cigarettes, uncomplicated: Secondary | ICD-10-CM | POA: Diagnosis not present

## 2017-01-29 MED ORDER — HYDROCORTISONE 2.5 % RE CREA: TOPICAL_CREAM | RECTAL | 1 refills | Status: AC

## 2017-01-29 NOTE — ED Notes (Signed)
At bedside with PA

## 2017-01-29 NOTE — ED Triage Notes (Signed)
Pt states she has "little balls on my butthole". Pt states they are painful and itch.

## 2017-01-29 NOTE — ED Notes (Signed)
NAD noted at time of D/C. Pt denies questions or concerns. Pt ambulatory to the lobby at this time.  

## 2017-01-29 NOTE — ED Provider Notes (Signed)
[facility] Emergency Department Provider Note  ____________________________________________  Time seen: Approximately 9:49 PM  I have reviewed the triage vital signs and the nursing notes.   HISTORY  Chief Complaint Hemorrhoids    HPI Mary Wong is a 27 y.o. female who presents to the emergency department for evaluation and of rectal pain.  She states that she has had this pain intermittently for several months, but over the past few days it is gotten much worse.  She denies any rectal bleeding.  She states that today, she looked at herself with the mirror and noticed that she has 2 "balls" on her rectum.  She also states that the areas itch and burn.  No alleviating measures have been attempted for this complaint.  Past Medical History:  Diagnosis Date  . Medical history non-contributory   . Ovarian cyst     Patient Active Problem List   Diagnosis Date Noted  . Preterm labor in third trimester with preterm delivery 02/29/2016  . Malpresentation of fetus, fetus 2 of multiple gestation 02/29/2016  . Discordant fetal growth in twin gestation, third trimester 02/29/2016  . Depression affecting pregnancy 02/29/2016  . Anemia affecting pregnancy 02/29/2016  . Smoking (tobacco) complicating pregnancy, third trimester 02/29/2016  . History of preterm delivery, currently pregnant 02/29/2016  . Insufficient prenatal care 02/29/2016  . Twin newborn resulting from both spontaneous ovulation and conception, delivered by cesarean in hospital 02/29/2016  . S/P cesarean section 02/29/2016  . Twin pregnancy, twins dichorionic and diamniotic 11/06/2015    Past Surgical History:  Procedure Laterality Date  . CESAREAN SECTION WITH BILATERAL TUBAL LIGATION N/A 02/29/2016   Procedure: CESAREAN SECTION WITH BILATERAL TUBAL LIGATION;  Surgeon: Elenora Fenderhelsea C Ward, MD;  Location: [facility] ORS;  Service: Obstetrics;  Laterality: N/A;  . NO PAST SURGERIES      Prior to  Admission medications   Medication Sig Start Date End Date Taking? Authorizing Provider  ferrous sulfate 325 (65 FE) MG EC tablet Take 325 mg by mouth 3 (three) times daily with meals.    [provider]  hydrocortisone (ANUSOL-HC) 2.5 % rectal cream Apply rectally 2 times daily 01/29/17 01/29/18  Marji Kuehnel, Rulon Eisenmengerari B, FNP  oxyCODONE (OXY IR/ROXICODONE) 5 MG immediate release tablet Take 1 tablet (5 mg total) by mouth every 4 (four) hours as needed (pain scale 4-7). 03/03/16   Sharee PimpleJones, Caron W, CNM  Prenatal Vit-Fe Fumarate-FA (MULTIVITAMIN-PRENATAL) 27-0.8 MG TABS tablet Take 1 tablet by mouth daily at 12 noon.    [provider]  Sertraline HCl (ZOLOFT PO) Take by mouth.    [provider]  simethicone (GAS-X) 80 MG chewable tablet Chew 1 tablet (80 mg total) by mouth every 6 (six) hours as needed for flatulence. 03/03/16   Sharee PimpleJones, Caron W, CNM    Allergies Patient has no known allergies.  No family history on file.  Social History Social History   Tobacco Use  . Smoking status: Current Every Day Smoker    Packs/day: 0.25    Types: Cigarettes  . Smokeless tobacco: Never Used  Substance Use Topics  . Alcohol use: No  . Drug use: No    Review of Systems Constitutional: Negative for fever. Respiratory: Negative for shortness of breath or cough. Gastrointestinal: Negative for abdominal pain; negative for nausea , negative for vomiting. Genitourinary: Negative for dysuria , negative negative for vaginal discharge. Musculoskeletal: Negative for back pain. Skin: Positive for protruding lesions on rectum. ____________________________________________   PHYSICAL EXAM:  VITAL  SIGNS: ED Triage Vitals [01/29/17 2138]  Enc Vitals Group     BP 121/71     Pulse Rate 83     Resp 18     Temp 98.5 F (36.9 C)     Temp Source Oral     SpO2 100 %     Weight 163 lb (73.9 kg)     Height 5\' 9"  (1.753 m)     Head Circumference      Peak Flow      Pain Score 4     Pain  Loc      Pain Edu?      Excl. in GC?     Constitutional: Alert and oriented. Well appearing and in no acute distress. Eyes: Conjunctivae are normal. PERRL. EOMI. Head: Atraumatic. Nose: No congestion/rhinnorhea. Mouth/Throat: Mucous membranes are moist. Respiratory: Normal respiratory effort.  No retractions. Gastrointestinal: Abdomen is soft and nontender. Rectal:  2 hemorrhoids noted and do not appear to be thrombosed. Musculoskeletal: No extremity tenderness nor edema.  Neurologic:  Normal speech and language. No gross focal neurologic deficits are appreciated. Speech is normal. No gait instability. Skin:  Skin is warm, dry and intact. No rash noted. Psychiatric: Mood and affect are normal. Speech and behavior are normal.  ____________________________________________   LABS (all labs ordered are listed, but only abnormal results are displayed)  Labs Reviewed - No data to display ____________________________________________  RADIOLOGY  Not indicated. ____________________________________________   PROCEDURES  Procedure(s) performed: None  ____________________________________________  27 year old female presenting to the emergency department for treatment and evaluation of hemorrhoids.  She was given a prescription for Anusol and advised to follow-up with the primary care provider for choice for symptoms that are not improving over the next few days she was also given information about high-fiber diet and encouraged to avoid constipation.  She is advised to return to the emergency department for symptoms of change or worsen if unable to schedule an appointment.  INITIAL IMPRESSION / ASSESSMENT AND PLAN / ED COURSE  Pertinent labs & imaging results that were available during my care of the patient were reviewed by me and considered in my medical decision making (see chart for details).  ____________________________________________   FINAL CLINICAL IMPRESSION(S) / ED  DIAGNOSES  Final diagnoses:  Grade III hemorrhoids    Note:  This document was prepared using Dragon voice recognition software and may include unintentional dictation errors.    Chinita Pester, FNP 01/29/17 2201    Sharman Cheek, MD 01/30/17 2329

## 2017-01-29 NOTE — ED Notes (Signed)
Pt noted to have several small hemorrhoids and 2 larger hemorrhoids; pt says she noticed the areas a few weeks ago, but noticed the pain when she wiped this am after having a bowel movement

## 2017-04-30 ENCOUNTER — Other Ambulatory Visit: Payer: Self-pay

## 2017-04-30 DIAGNOSIS — G44209 Tension-type headache, unspecified, not intractable: Secondary | ICD-10-CM | POA: Insufficient documentation

## 2017-04-30 DIAGNOSIS — F17210 Nicotine dependence, cigarettes, uncomplicated: Secondary | ICD-10-CM | POA: Diagnosis not present

## 2017-04-30 DIAGNOSIS — Z79899 Other long term (current) drug therapy: Secondary | ICD-10-CM | POA: Insufficient documentation

## 2017-04-30 DIAGNOSIS — R51 Headache: Secondary | ICD-10-CM | POA: Diagnosis present

## 2017-04-30 NOTE — ED Triage Notes (Signed)
Pt states has had generalized headaches for months. Pt states pain is worse when she bends over. Pt states she does have nausea intermittently with headaches and intermittent lightheadedness in past. Pt appears in no acute distress. Pt states she takes ibuprofen and tylenol for pain.

## 2017-05-01 ENCOUNTER — Emergency Department: Payer: Medicaid Other

## 2017-05-01 ENCOUNTER — Emergency Department
Admission: EM | Admit: 2017-05-01 | Discharge: 2017-05-01 | Disposition: A | Discharge status: Home / Self Care | Payer: Medicaid Other | Attending: Emergency Medicine | Admitting: Emergency Medicine

## 2017-05-01 DIAGNOSIS — G44209 Tension-type headache, unspecified, not intractable: Secondary | ICD-10-CM

## 2017-05-01 MED ORDER — DIPHENHYDRAMINE HCL 50 MG/ML IJ SOLN
12.5000 mg | Freq: Once | INTRAMUSCULAR | Status: AC
  Administered 2017-05-01: 12.5 mg via INTRAVENOUS
  Filled 2017-05-01: qty 1

## 2017-05-01 MED ORDER — PROCHLORPERAZINE EDISYLATE 10 MG/2ML IJ SOLN
5.0000 mg | Freq: Once | INTRAMUSCULAR | Status: AC
  Administered 2017-05-01: 5 mg via INTRAVENOUS
  Filled 2017-05-01: qty 2

## 2017-05-01 MED ORDER — DEXTROSE-NACL 5-0.45 % IV SOLN
INTRAVENOUS | Status: DC
  Administered 2017-05-01: 02:00:00 via INTRAVENOUS

## 2017-05-01 MED ORDER — KETOROLAC TROMETHAMINE 30 MG/ML IJ SOLN
10.0000 mg | Freq: Once | INTRAMUSCULAR | Status: AC
  Administered 2017-05-01: 9.9 mg via INTRAVENOUS
  Filled 2017-05-01: qty 1

## 2017-05-01 MED ORDER — BUTALBITAL-APAP-CAFFEINE 50-325-40 MG PO TABS: 1.0000 | ORAL_TABLET | Freq: Four times a day (QID) | ORAL | 0 refills | Status: DC | PRN

## 2017-05-01 NOTE — ED Notes (Signed)
Pt complains of light sensitivity. Pt is watching a video on cell phone currently in no acute distress.

## 2017-05-01 NOTE — ED Provider Notes (Signed)
[facility] Emergency Department Provider Note   ____________________________________________   First MD Initiated Contact with Patient 05/01/17 0121     (approximate)  I have reviewed the triage vital signs and the nursing notes.   HISTORY  Chief Complaint Headache    HPI Mary Wong is a 27 y.o. female who presents to the ED from home with a chief complaint of headache.  Patient reports generalized headaches for the past 2 to 3 months.  Describes a daily, throbbing headache mainly to her temples.  Symptoms associated with photophobia and occasional nausea without vomiting.  Patient tried ibuprofen and Tylenol without relief of symptoms.  She is 1 year postpartum status post twins (4 kids total), not breast-feeding.  Denies recent fever, chills, neck pain, chest pain, shortness of breath, abdominal pain, vomiting.  Denies recent travel or trauma.  Denies recent tick bites.   Past Medical History:  Diagnosis Date  . Medical history non-contributory   . Ovarian cyst     Patient Active Problem List   Diagnosis Date Noted  . Preterm labor in third trimester with preterm delivery 02/29/2016  . Malpresentation of fetus, fetus 2 of multiple gestation 02/29/2016  . Discordant fetal growth in twin gestation, third trimester 02/29/2016  . Depression affecting pregnancy 02/29/2016  . Anemia affecting pregnancy 02/29/2016  . Smoking (tobacco) complicating pregnancy, third trimester 02/29/2016  . History of preterm delivery, currently pregnant 02/29/2016  . Insufficient prenatal care 02/29/2016  . Twin newborn resulting from both spontaneous ovulation and conception, delivered by cesarean in hospital 02/29/2016  . S/P cesarean section 02/29/2016  . Twin pregnancy, twins dichorionic and diamniotic 11/06/2015    Past Surgical History:  Procedure Laterality Date  . CESAREAN SECTION WITH BILATERAL TUBAL LIGATION N/A 02/29/2016   Procedure: CESAREAN SECTION  WITH BILATERAL TUBAL LIGATION;  Surgeon: Elenora Fender Ward, MD;  Location: [facility] ORS;  Service: Obstetrics;  Laterality: N/A;  . NO PAST SURGERIES      Prior to Admission medications   Medication Sig Start Date End Date Taking? Authorizing Provider  butalbital-acetaminophen-caffeine (FIORICET, ESGIC) [phone] MG tablet Take 1 tablet by mouth every 6 (six) hours as needed for headache. 05/01/17   Irean Hong, MD  ferrous sulfate 325 (65 FE) MG EC tablet Take 325 mg by mouth 3 (three) times daily with meals.    [provider]  hydrocortisone (ANUSOL-HC) 2.5 % rectal cream Apply rectally 2 times daily 01/29/17 01/29/18  Triplett, Rulon Eisenmenger B, FNP  oxyCODONE (OXY IR/ROXICODONE) 5 MG immediate release tablet Take 1 tablet (5 mg total) by mouth every 4 (four) hours as needed (pain scale 4-7). 03/03/16   Sharee Pimple, CNM  Prenatal Vit-Fe Fumarate-FA (MULTIVITAMIN-PRENATAL) 27-0.8 MG TABS tablet Take 1 tablet by mouth daily at 12 noon.    [provider]  Sertraline HCl (ZOLOFT PO) Take by mouth.    [provider]  simethicone (GAS-X) 80 MG chewable tablet Chew 1 tablet (80 mg total) by mouth every 6 (six) hours as needed for flatulence. 03/03/16   Sharee Pimple, CNM    Allergies Patient has no known allergies.  Family history None for migraines None for cerebral aneurysm  Social History Social History   Tobacco Use  . Smoking status: Current Every Day Smoker    Packs/day: 0.25    Types: Cigarettes  . Smokeless tobacco: Never Used  Substance Use Topics  . Alcohol use: No  . Drug use: No    Review of  Systems  Constitutional: No fever/chills. Eyes: No visual changes. ENT: No sore throat. Cardiovascular: Denies chest pain. Respiratory: Denies shortness of breath. Gastrointestinal: No abdominal pain.  No nausea, no vomiting.  No diarrhea.  No constipation. Genitourinary: Negative for dysuria. Musculoskeletal: Negative for back pain. Skin: Negative for  rash. Neurological: Positive for headaches.  Negative for focal weakness or numbness.   ____________________________________________   PHYSICAL EXAM:  VITAL SIGNS: ED Triage Vitals  Enc Vitals Group     BP 04/30/17 2211 118/69     Pulse Rate 04/30/17 2211 70     Resp --      Temp 04/30/17 2211 98.2 F (36.8 C)     Temp Source 04/30/17 2211 Oral     SpO2 04/30/17 2211 100 %     Weight 04/30/17 2211 152 lb (68.9 kg)     Height 04/30/17 2211  (1.803 m)     Head Circumference --      Peak Flow --      Pain Score 04/30/17 2217 6     Pain Loc --      Pain Edu? --      Excl. in GC? --     Constitutional: Alert and oriented. Well appearing and in mild acute distress. Eyes: Conjunctivae are normal. PERRL. EOMI. Head: Atraumatic.  Mildly tender to palpation bilateral temples. Nose: No congestion/rhinnorhea. Mouth/Throat: Mucous membranes are moist.  Oropharynx non-erythematous. Neck: No stridor.  No cervical spine tenderness to palpation.  Supple neck without meningismus.  Bilateral trapezius muscle spasms. Cardiovascular: Normal rate, regular rhythm. Grossly normal heart sounds.  Good peripheral circulation. Respiratory: Normal respiratory effort.  No retractions. Lungs CTAB. Gastrointestinal: Soft and nontender. No distention. No abdominal bruits. No CVA tenderness. Musculoskeletal: No lower extremity tenderness nor edema.  No joint effusions. Neurologic: Alert and oriented x4.  CN II-XII grossly intact.  Normal speech and language. No gross focal neurologic deficits are appreciated. MAEx4.  No gait instability. Skin:  Skin is warm, dry and intact. No rash noted.  No petechiae. Psychiatric: Mood and affect are normal. Speech and behavior are normal.  ____________________________________________   LABS (all labs ordered are listed, but only abnormal results are displayed)  Labs Reviewed - No data to  display ____________________________________________  EKG  None ____________________________________________  RADIOLOGY  ED MD interpretation: No ICH  Official radiology report(s): Ct Head Wo Contrast  Result Date: 05/01/2017 CLINICAL DATA:  Generalize headaches for months. Nausea and intermittent lightheadedness. EXAM: CT HEAD WITHOUT CONTRAST TECHNIQUE: Contiguous axial images were obtained from the base of the skull through the vertex without intravenous contrast. COMPARISON:  None. FINDINGS: Brain: No evidence of acute infarction, hemorrhage, hydrocephalus, extra-axial collection or mass lesion/mass effect. Vascular: No hyperdense vessel or unexpected calcification. Skull: Normal. Negative for fracture or focal lesion. Sinuses/Orbits: No acute finding. Other: None. IMPRESSION: No acute intracranial abnormalities. Electronically Signed   By: Burman Nieves M.D.   On: 05/01/2017 03:00    ____________________________________________   PROCEDURES  Procedure(s) performed: None  Procedures  Critical Care performed: No  ____________________________________________   INITIAL IMPRESSION / ASSESSMENT AND PLAN / ED COURSE  As part of my medical decision making, I reviewed the following data within the electronic MEDICAL RECORD NUMBER Nursing notes reviewed and incorporated, Old chart reviewed, Radiograph reviewed and Notes from prior ED visits   27 year old female who presents with a several month history of generalized headache. Differential diagnosis includes, but is not limited to, intracranial hemorrhage, meningitis/encephalitis, previous head trauma, cavernous venous  thrombosis, tension headache, temporal arteritis, migraine or migraine equivalent, idiopathic intracranial hypertension, and non-specific headache.   Patient is neurologically intact without focal deficits; neck is supple without meningismus.  Given that she does not have a migraine history, will obtain CT head to  evaluate for intracranial abnormality.  Will initiate IV fluids, Compazine, Toradol and Benadryl for headache.  Will reassess.   Clinical Course as of May 01 549  Sun May 01, 2017  0303 Headache resolved very shortly after administration of medicines.  Patient had a very brief episode where she felt restless and jittery, most likely due to Compazine.  She fell asleep before a repeat dose of Benadryl could be given.  Awakened patient to update her of CT head results.  Will discharge home on Fioricet, and encourage patient to follow-up closely with her PCP next week.  Strict return precautions given.  Patient and friend verbalize understanding and agree with plan of care.   [JS]    Clinical Course User Index [JS] Irean Hong, MD     ____________________________________________   FINAL CLINICAL IMPRESSION(S) / ED DIAGNOSES  Final diagnoses:  Acute non intractable tension-type headache     ED Discharge Orders        Ordered    butalbital-acetaminophen-caffeine (FIORICET, ESGIC) 50-325-40 MG tablet  Every 6 hours PRN     05/01/17 0305       Note:  This document was prepared using Dragon voice recognition software and may include unintentional dictation errors.    Irean Hong, MD 05/01/17 [phone]

## 2017-05-01 NOTE — ED Notes (Signed)
Patient transported to CT 

## 2017-05-01 NOTE — Discharge Instructions (Addendum)
1.  You may take Fioricet (#20) as needed for headache. 2.  Return to the ER for worsening symptoms, persistent vomiting, difficulty breathing or other concerns.

## 2017-05-23 DIAGNOSIS — R519 Headache, unspecified: Secondary | ICD-10-CM | POA: Insufficient documentation

## 2018-04-15 ENCOUNTER — Other Ambulatory Visit: Payer: Self-pay

## 2018-04-15 ENCOUNTER — Emergency Department
Admission: EM | Admit: 2018-04-15 | Discharge: 2018-04-15 | Disposition: A | Discharge status: Home / Self Care | Payer: Medicaid Other | Attending: Emergency Medicine | Admitting: Emergency Medicine

## 2018-04-15 ENCOUNTER — Emergency Department: Payer: Medicaid Other

## 2018-04-15 ENCOUNTER — Encounter: Payer: Self-pay | Admitting: Emergency Medicine

## 2018-04-15 DIAGNOSIS — R52 Pain, unspecified: Secondary | ICD-10-CM | POA: Diagnosis not present

## 2018-04-15 DIAGNOSIS — F17210 Nicotine dependence, cigarettes, uncomplicated: Secondary | ICD-10-CM | POA: Diagnosis not present

## 2018-04-15 DIAGNOSIS — R509 Fever, unspecified: Secondary | ICD-10-CM | POA: Diagnosis not present

## 2018-04-15 DIAGNOSIS — B342 Coronavirus infection, unspecified: Secondary | ICD-10-CM

## 2018-04-15 DIAGNOSIS — Z79899 Other long term (current) drug therapy: Secondary | ICD-10-CM | POA: Insufficient documentation

## 2018-04-15 DIAGNOSIS — R05 Cough: Secondary | ICD-10-CM | POA: Diagnosis present

## 2018-04-15 DIAGNOSIS — R5383 Other fatigue: Secondary | ICD-10-CM | POA: Diagnosis not present

## 2018-04-15 NOTE — ED Triage Notes (Signed)
Cough and body aches x 2 days. States has had fever.

## 2018-04-15 NOTE — ED Provider Notes (Signed)
[facility] Emergency Department Provider Note   ____________________________________________    I have reviewed the triage vital signs and the nursing notes.   HISTORY  Chief Complaint Cough and Generalized Body Aches     HPI Mary Wong is a 28 y.o. female who presents with complaints of cough, significant body aches, fatigue, fevers.  Patient reports symptoms started yesterday.  No known exposure to coronavirus positive patients.  No recent travel.  Denies shortness of breath.  No sick contacts noted.  Is not take anything for this.  Past Medical History:  Diagnosis Date  . Medical history non-contributory   . Ovarian cyst     Patient Active Problem List   Diagnosis Date Noted  . Preterm labor in third trimester with preterm delivery 02/29/2016  . Malpresentation of fetus, fetus 2 of multiple gestation 02/29/2016  . Discordant fetal growth in twin gestation, third trimester 02/29/2016  . Depression affecting pregnancy 02/29/2016  . Anemia affecting pregnancy 02/29/2016  . Smoking (tobacco) complicating pregnancy, third trimester 02/29/2016  . History of preterm delivery, currently pregnant 02/29/2016  . Insufficient prenatal care 02/29/2016  . Twin newborn resulting from both spontaneous ovulation and conception, delivered by cesarean in hospital 02/29/2016  . S/P cesarean section 02/29/2016  . Twin pregnancy, twins dichorionic and diamniotic 11/06/2015    Past Surgical History:  Procedure Laterality Date  . CESAREAN SECTION WITH BILATERAL TUBAL LIGATION N/A 02/29/2016   Procedure: CESAREAN SECTION WITH BILATERAL TUBAL LIGATION;  Surgeon: Elenora Fender Ward, MD;  Location: [facility] ORS;  Service: Obstetrics;  Laterality: N/A;  . NO PAST SURGERIES      Prior to Admission medications   Medication Sig Start Date End Date Taking? Authorizing Provider  butalbital-acetaminophen-caffeine (FIORICET, ESGIC) [phone] MG tablet Take 1 tablet by  mouth every 6 (six) hours as needed for headache. 05/01/17   Irean Hong, MD  ferrous sulfate 325 (65 FE) MG EC tablet Take 325 mg by mouth 3 (three) times daily with meals.    [provider]  oxyCODONE (OXY IR/ROXICODONE) 5 MG immediate release tablet Take 1 tablet (5 mg total) by mouth every 4 (four) hours as needed (pain scale 4-7). 03/03/16   Sharee Pimple, CNM  Prenatal Vit-Fe Fumarate-FA (MULTIVITAMIN-PRENATAL) 27-0.8 MG TABS tablet Take 1 tablet by mouth daily at 12 noon.    [provider]  Sertraline HCl (ZOLOFT PO) Take by mouth.    [provider]  simethicone (GAS-X) 80 MG chewable tablet Chew 1 tablet (80 mg total) by mouth every 6 (six) hours as needed for flatulence. 03/03/16   Sharee Pimple, CNM     Allergies Patient has no known allergies.  No family history on file.  Social History Social History   Tobacco Use  . Smoking status: Current Every Day Smoker    Packs/day: 0.25    Types: Cigarettes  . Smokeless tobacco: Never Used  Substance Use Topics  . Alcohol use: No  . Drug use: No    Review of Systems  Constitutional: No fever/chills Eyes: No visual changes.  ENT: No sore throat. Cardiovascular: Denies chest pain. Respiratory: As above Gastrointestinal: No abdominal pain.   Genitourinary: Negative for dysuria. Musculoskeletal: Negative for back pain. Skin: Negative for rash. Neurological: Negative for headaches   ____________________________________________   PHYSICAL EXAM:  VITAL SIGNS: ED Triage Vitals  Enc Vitals Group     BP 04/15/18 1039 130/73     Pulse Rate 04/15/18 1039 96  Resp 04/15/18 1039 18     Temp 04/15/18 1039 98.4 F (36.9 C)     Temp Source 04/15/18 1039 Oral     SpO2 04/15/18 1039 100 %     Weight 04/15/18 1041 69.9 kg (154 lb)     Height 04/15/18 1041 1.753 m (5\' 9" )     Head Circumference --      Peak Flow --      Pain Score 04/15/18 1040 7     Pain Loc --      Pain Edu? --      Excl.  in GC? --     Constitutional: Alert and oriented.  Eyes: Conjunctivae are normal.   Nose: No congestion/rhinnorhea. Mouth/Throat: Mucous membranes are moist.    Cardiovascular: Normal rate, regular rhythm. Grossly normal heart sounds.  Good peripheral circulation. Respiratory: Normal respiratory effort.  No retractions. Lungs CTAB. Gastrointestinal: Soft and nontender. No distention.    Musculoskeletal:   Warm and well perfused Neurologic:  Normal speech and language. No gross focal neurologic deficits are appreciated.  Skin:  Skin is warm, dry and intact. No rash noted. Psychiatric: Mood and affect are normal. Speech and behavior are normal.  ____________________________________________   LABS (all labs ordered are listed, but only abnormal results are displayed)  Labs Reviewed - No data to display ____________________________________________  EKG  None ____________________________________________  RADIOLOGY  Chest x-ray unremarkable ____________________________________________   PROCEDURES  Procedure(s) performed: No  Procedures   Critical Care performed: No ____________________________________________   INITIAL IMPRESSION / ASSESSMENT AND PLAN / ED COURSE  Pertinent labs & imaging results that were available during my care of the patient were reviewed by me and considered in my medical decision making (see chart for details).  Patient presents with cough, fever, myalgias consistent with viral illness, given pandemic suspicious for COVID-19, versus late influenza versus nonspecific viral illness.  No shortness of breath, vital signs reassuring, exam reassuring, x-ray benign.  Appropriate for outpatient follow-up as needed, strict return precautions including any shortness of breath.  Discussed strict quarantine as well    ____________________________________________   FINAL CLINICAL IMPRESSION(S) / ED DIAGNOSES  Final diagnoses:  Coronavirus infection         Note:  This document was prepared using Dragon voice recognition software and may include unintentional dictation errors.   Jene EveryKinner, Bonne Whack, MD 04/15/18 [phone]

## 2019-06-17 ENCOUNTER — Other Ambulatory Visit: Payer: Self-pay

## 2019-06-17 ENCOUNTER — Encounter: Payer: Self-pay | Admitting: Intensive Care

## 2019-06-17 ENCOUNTER — Emergency Department
Admission: EM | Admit: 2019-06-17 | Discharge: 2019-06-17 | Disposition: A | Discharge status: Home / Self Care | Payer: Medicaid Other | Attending: Emergency Medicine | Admitting: Emergency Medicine

## 2019-06-17 DIAGNOSIS — R1013 Epigastric pain: Secondary | ICD-10-CM | POA: Diagnosis not present

## 2019-06-17 DIAGNOSIS — F17210 Nicotine dependence, cigarettes, uncomplicated: Secondary | ICD-10-CM | POA: Diagnosis not present

## 2019-06-17 HISTORY — DX: Scoliosis, unspecified: M41.9

## 2019-06-17 LAB — COMPREHENSIVE METABOLIC PANEL
ALT: 10 U/L (ref 0–44)
AST: 12 U/L — ABNORMAL LOW (ref 15–41)
Albumin: 4.2 g/dL (ref 3.5–5.0)
Alkaline Phosphatase: 54 U/L (ref 38–126)
Anion gap: 7 (ref 5–15)
BUN: 9 mg/dL (ref 6–20)
CO2: 24 mmol/L (ref 22–32)
Calcium: 8.8 mg/dL — ABNORMAL LOW (ref 8.9–10.3)
Chloride: 107 mmol/L (ref 98–111)
Creatinine, Ser: 0.51 mg/dL (ref 0.44–1.00)
GFR calc Af Amer: >60 mL/min (ref >60)
GFR calc non Af Amer: >60 mL/min (ref >60)
Glucose, Bld: 86 mg/dL (ref 70–99)
Potassium: 3.7 mmol/L (ref 3.5–5.1)
Sodium: 138 mmol/L (ref 135–145)
Total Bilirubin: 0.6 mg/dL (ref 0.3–1.2)
Total Protein: 6.8 g/dL (ref 6.5–8.1)

## 2019-06-17 LAB — URINALYSIS, COMPLETE (UACMP) WITH MICROSCOPIC
Bacteria, UA: NONE SEEN
Bilirubin Urine: NEGATIVE
Glucose, UA: NEGATIVE mg/dL
Hgb urine dipstick: NEGATIVE
Ketones, ur: NEGATIVE mg/dL
Nitrite: NEGATIVE
Protein, ur: NEGATIVE mg/dL
Specific Gravity, Urine: 1.019 (ref 1.005–1.030)
pH: 6 (ref 5.0–8.0)

## 2019-06-17 LAB — POCT PREGNANCY, URINE: Preg Test, Ur: NEGATIVE

## 2019-06-17 LAB — CBC
HCT: 40.1 % (ref 36.0–46.0)
Hemoglobin: 12.9 g/dL (ref 12.0–15.0)
MCH: 29.4 pg (ref 26.0–34.0)
MCHC: 32.2 g/dL (ref 30.0–36.0)
MCV: 91.3 fL (ref 80.0–100.0)
Platelets: 206 10*3/uL (ref 150–400)
RBC: 4.39 MIL/uL (ref 3.87–5.11)
RDW: 13.1 % (ref 11.5–15.5)
WBC: 8.5 10*3/uL (ref 4.0–10.5)
nRBC: 0 % (ref 0.0–0.2)

## 2019-06-17 LAB — LIPASE, BLOOD: Lipase: 40 U/L (ref 11–51)

## 2019-06-17 MED ORDER — OMEPRAZOLE MAGNESIUM 20 MG PO TBEC: 20.0000 mg | DELAYED_RELEASE_TABLET | Freq: Every day | ORAL | 1 refills | Status: DC

## 2019-06-17 NOTE — ED Triage Notes (Signed)
Patient c/o sharp abdominal pain for a few days with no N/V/D. Last BM yesterday

## 2019-06-17 NOTE — ED Provider Notes (Signed)
[facility] Emergency Department Provider Note ____________________________________________  Time seen: 34  I have reviewed the triage vital signs and the nursing notes.  HISTORY  Chief Complaint  Abdominal Pain  HPI Mary Wong is a 29 y.o. female presents to the ED for evaluation of now resolved, sharp, intermittent epigastric abdominal pain for a few days. She denies N/V/D, vaginal discharge, or abnormal vaginal bleeding.  Patient reports that symptoms have been intermittent for the last 2 weeks.  She denies any history of gallstones, pancreatitis, ulcers, or colitis.  She denies bad food, sick contacts, or recent exposures.  She has not taken any medications in the interim to alleviate symptoms.  Patient is not aware of any timing, aggravating factors, or pre-/postprandial timing of her abdominal pain.  Past Medical History:  Diagnosis Date  . Medical history non-contributory   . Ovarian cyst   . Scoliosis     Patient Active Problem List   Diagnosis Date Noted  . Preterm labor in third trimester with preterm delivery 02/29/2016  . Malpresentation of fetus, fetus 2 of multiple gestation 02/29/2016  . Discordant fetal growth in twin gestation, third trimester 02/29/2016  . Depression affecting pregnancy 02/29/2016  . Anemia affecting pregnancy 02/29/2016  . Smoking (tobacco) complicating pregnancy, third trimester 02/29/2016  . History of preterm delivery, currently pregnant 02/29/2016  . Insufficient prenatal care 02/29/2016  . Twin newborn resulting from both spontaneous ovulation and conception, delivered by cesarean in hospital 02/29/2016  . S/P cesarean section 02/29/2016  . Twin pregnancy, twins dichorionic and diamniotic 11/06/2015    Past Surgical History:  Procedure Laterality Date  . CESAREAN SECTION WITH BILATERAL TUBAL LIGATION N/A 02/29/2016   Procedure: CESAREAN SECTION WITH BILATERAL TUBAL LIGATION;  Surgeon: Honor Loh Ward, MD;   Location: [facility] ORS;  Service: Obstetrics;  Laterality: N/A;  . NO PAST SURGERIES      Prior to Admission medications   Medication Sig Start Date End Date Taking? Authorizing Provider  butalbital-acetaminophen-caffeine (FIORICET, ESGIC) [phone] MG tablet Take 1 tablet by mouth every 6 (six) hours as needed for headache. 05/01/17   Paulette Blanch, MD  ferrous sulfate 325 (65 FE) MG EC tablet Take 325 mg by mouth 3 (three) times daily with meals.    [provider]  omeprazole (PRILOSEC OTC) 20 MG tablet Take 1 tablet (20 mg total) by mouth daily. 06/17/19 08/16/19  Cinch Ormond, Dannielle Karvonen, PA-C  oxyCODONE (OXY IR/ROXICODONE) 5 MG immediate release tablet Take 1 tablet (5 mg total) by mouth every 4 (four) hours as needed (pain scale 4-7). 03/03/16   Catheryn Bacon, CNM  Prenatal Vit-Fe Fumarate-FA (MULTIVITAMIN-PRENATAL) 27-0.8 MG TABS tablet Take 1 tablet by mouth daily at 12 noon.    [provider]  Sertraline HCl (ZOLOFT PO) Take by mouth.    [provider]  simethicone (GAS-X) 80 MG chewable tablet Chew 1 tablet (80 mg total) by mouth every 6 (six) hours as needed for flatulence. 03/03/16   Catheryn Bacon, CNM    Allergies Patient has no known allergies.  History reviewed. No pertinent family history.  Social History Social History   Tobacco Use  . Smoking status: Current Every Day Smoker    Packs/day: 0.25    Types: Cigarettes  . Smokeless tobacco: Never Used  Substance Use Topics  . Alcohol use: No  . Drug use: No    Review of Systems  Constitutional: Negative for fever. Cardiovascular: Negative for chest pain. Respiratory: Negative for  shortness of breath. Gastrointestinal: Positive for abdominal pain. Denies nausea, vomiting and diarrhea. Genitourinary: Negative for dysuria. Musculoskeletal: Negative for back pain. Skin: Negative for rash. Neurological: Negative for headaches, focal weakness or  numbness. ____________________________________________  PHYSICAL EXAM:  VITAL SIGNS: ED Triage Vitals [06/17/19 1627]  Enc Vitals Group     BP 122/64     Pulse Rate 91     Resp 16     Temp 99.1 F (37.3 C)     Temp Source Oral     SpO2 100 %     Weight 163 lb (73.9 kg)     Height 5\' 9"  (1.753 m)     Head Circumference      Peak Flow      Pain Score 4     Pain Loc      Pain Edu?      Excl. in GC?     Constitutional: Alert and oriented. Well appearing and in no distress. Head: Normocephalic and atraumatic. Eyes: Conjunctivae are normal. Normal extraocular movements Cardiovascular: Normal rate, regular rhythm. Normal distal pulses. Respiratory: Normal respiratory effort. No wheezes/rales/rhonchi. Gastrointestinal: Soft and nontender to palp over the epigastrium and general abdomen. No distention, rebound, guarding, or rigidity.  No organomegaly elicited.  No flank pain tenderness noted.  Normoactive bowel sounds x4. Musculoskeletal: Nontender with normal range of motion in all extremities.  Neurologic:  Normal gait without ataxia. Normal speech and language. No gross focal neurologic deficits are appreciated. Skin:  Skin is warm, dry and intact. No rash noted. ____________________________________________   LABS (pertinent positives/negatives)  Labs Reviewed  COMPREHENSIVE METABOLIC PANEL - Abnormal; Notable for the following components:      Result Value   Calcium 8.8 (*)    AST 12 (*)    All other components within normal limits  URINALYSIS, COMPLETE (UACMP) WITH MICROSCOPIC - Abnormal; Notable for the following components:   Color, Urine YELLOW (*)    APPearance HAZY (*)    Leukocytes,Ua LARGE (*)    All other components within normal limits  LIPASE, BLOOD  CBC  POC URINE PREG, ED  POCT PREGNANCY, URINE  ____________________________________________  PROCEDURES  Procedures ____________________________________________  INITIAL IMPRESSION / ASSESSMENT AND PLAN  / ED COURSE  Differential diagnosis includes, but is not limited to, biliary disease (biliary colic, acute cholecystitis, cholangitis, choledocholithiasis, etc), intrathoracic causes for epigastric abdominal pain including ACS, gastritis, duodenitis, pancreatitis, small bowel or large bowel obstruction, abdominal aortic aneurysm, hernia, and ulcer(s).  Patient with ED evaluation of intermittent epigastric pain, which is now resolved. Her labs are reassuring and exam is normal at this time. No signs of an acute abdominal process. Patient declined further evaluation by or CT. She is willing to start on a daily PPI for what sounds like epigastric pain due to mild gastritis vs GERD. She will follow-up with her provider or return to the ED as discussed.   Mary Wong was evaluated in Emergency Department on 06/17/2019 for the symptoms described in the history of present illness. She was evaluated in the context of the global COVID-19 pandemic, which necessitated consideration that the patient might be at risk for infection with the SARS-CoV-2 virus that causes COVID-19. Institutional protocols and algorithms that pertain to the evaluation of patients at risk for COVID-19 are in a state of rapid change based on information released by regulatory bodies including the CDC and federal and state organizations. These policies and algorithms were followed during the patient's care in the ED. ____________________________________________  FINAL CLINICAL IMPRESSION(S) / ED DIAGNOSES  Final diagnoses:  Epigastric pain      Katlen Seyer, Charlesetta Ivory, PA-C 06/17/19 2323    Concha Se, MD 06/19/19 [phone]

## 2019-06-17 NOTE — Discharge Instructions (Addendum)
Your exam and labs are normal at this time. Your abdominal pain is now resolved. Take the prescription med as directed. Follow-up with your provider as needed.

## 2019-06-18 ENCOUNTER — Emergency Department: Payer: Medicaid Other

## 2019-06-18 ENCOUNTER — Other Ambulatory Visit: Payer: Self-pay

## 2019-06-18 DIAGNOSIS — Y9389 Activity, other specified: Secondary | ICD-10-CM

## 2019-06-18 DIAGNOSIS — Y92009 Unspecified place in unspecified non-institutional (private) residence as the place of occurrence of the external cause: Secondary | ICD-10-CM

## 2019-06-18 DIAGNOSIS — M26622 Arthralgia of left temporomandibular joint: Secondary | ICD-10-CM | POA: Diagnosis present

## 2019-06-18 DIAGNOSIS — S0012XA Contusion of left eyelid and periocular area, initial encounter: Secondary | ICD-10-CM | POA: Diagnosis present

## 2019-06-18 DIAGNOSIS — Z20822 Contact with and (suspected) exposure to covid-19: Secondary | ICD-10-CM | POA: Diagnosis present

## 2019-06-18 DIAGNOSIS — R079 Chest pain, unspecified: Secondary | ICD-10-CM | POA: Diagnosis present

## 2019-06-18 DIAGNOSIS — K219 Gastro-esophageal reflux disease without esophagitis: Secondary | ICD-10-CM | POA: Diagnosis present

## 2019-06-18 DIAGNOSIS — R519 Headache, unspecified: Secondary | ICD-10-CM | POA: Diagnosis present

## 2019-06-18 DIAGNOSIS — W19XXXA Unspecified fall, initial encounter: Secondary | ICD-10-CM | POA: Diagnosis present

## 2019-06-18 DIAGNOSIS — F17210 Nicotine dependence, cigarettes, uncomplicated: Secondary | ICD-10-CM | POA: Diagnosis present

## 2019-06-18 DIAGNOSIS — G40B09 Juvenile myoclonic epilepsy, not intractable, without status epilepticus: Principal | ICD-10-CM | POA: Diagnosis present

## 2019-06-18 DIAGNOSIS — Z79899 Other long term (current) drug therapy: Secondary | ICD-10-CM

## 2019-06-18 LAB — BASIC METABOLIC PANEL
Anion gap: 6 (ref 5–15)
BUN: 11 mg/dL (ref 6–20)
CO2: 27 mmol/L (ref 22–32)
Calcium: 8.9 mg/dL (ref 8.9–10.3)
Chloride: 105 mmol/L (ref 98–111)
Creatinine, Ser: 0.61 mg/dL (ref 0.44–1.00)
GFR calc Af Amer: >60 mL/min (ref >60)
GFR calc non Af Amer: >60 mL/min (ref >60)
Glucose, Bld: 101 mg/dL — ABNORMAL HIGH (ref 70–99)
Potassium: 3.7 mmol/L (ref 3.5–5.1)
Sodium: 138 mmol/L (ref 135–145)

## 2019-06-18 LAB — CBC
HCT: 38.5 % (ref 36.0–46.0)
Hemoglobin: 12.7 g/dL (ref 12.0–15.0)
MCH: 29.6 pg (ref 26.0–34.0)
MCHC: 33 g/dL (ref 30.0–36.0)
MCV: 89.7 fL (ref 80.0–100.0)
Platelets: 217 10*3/uL (ref 150–400)
RBC: 4.29 MIL/uL (ref 3.87–5.11)
RDW: 13.1 % (ref 11.5–15.5)
WBC: 9.2 10*3/uL (ref 4.0–10.5)
nRBC: 0 % (ref 0.0–0.2)

## 2019-06-18 NOTE — ED Triage Notes (Addendum)
Pt arrives to ED via POV home with c/o possible seizure-like activity. Pt states being seen here yesterday for CP and taking the 1st dose of Omeprazole as rx/'d; pt states she was playing with her son and "don't remember anything after that". No loss of bowel or bladder control; family reports seizure lasted "about one minute". Pt also has a hematoma above the left eye from hitting her head during the seizure. Pt is currently A&O, in NAD; RR even, regular, and unlabored.

## 2019-06-19 ENCOUNTER — Other Ambulatory Visit: Payer: Self-pay

## 2019-06-19 ENCOUNTER — Emergency Department: Payer: Medicaid Other

## 2019-06-19 ENCOUNTER — Observation Stay: Payer: Medicaid Other

## 2019-06-19 ENCOUNTER — Encounter: Payer: Self-pay | Admitting: Radiology

## 2019-06-19 ENCOUNTER — Observation Stay (HOSPITAL_COMMUNITY)
Admit: 2019-06-19 | Discharge: 2019-06-19 | Disposition: A | Discharge status: Home / Self Care | Payer: Medicaid Other | Attending: Internal Medicine | Admitting: Internal Medicine

## 2019-06-19 ENCOUNTER — Inpatient Hospital Stay
Admission: EM | Admit: 2019-06-19 | Discharge: 2019-06-21 | DRG: 101 | Disposition: A | Discharge status: Home / Self Care | Payer: Medicaid Other | Attending: Internal Medicine | Admitting: Internal Medicine

## 2019-06-19 DIAGNOSIS — R55 Syncope and collapse: Secondary | ICD-10-CM | POA: Diagnosis not present

## 2019-06-19 DIAGNOSIS — G501 Atypical facial pain: Secondary | ICD-10-CM | POA: Diagnosis not present

## 2019-06-19 DIAGNOSIS — R569 Unspecified convulsions: Secondary | ICD-10-CM | POA: Diagnosis not present

## 2019-06-19 DIAGNOSIS — S0512XA Contusion of eyeball and orbital tissues, left eye, initial encounter: Secondary | ICD-10-CM

## 2019-06-19 DIAGNOSIS — S0993XA Unspecified injury of face, initial encounter: Secondary | ICD-10-CM

## 2019-06-19 DIAGNOSIS — G40B09 Juvenile myoclonic epilepsy, not intractable, without status epilepticus: Secondary | ICD-10-CM

## 2019-06-19 LAB — URINE DRUG SCREEN, QUALITATIVE (ARMC ONLY)
Amphetamines, Ur Screen: NOT DETECTED
Barbiturates, Ur Screen: NOT DETECTED
Benzodiazepine, Ur Scrn: NOT DETECTED
Cannabinoid 50 Ng, Ur ~~LOC~~: NOT DETECTED
Cocaine Metabolite,Ur ~~LOC~~: NOT DETECTED
MDMA (Ecstasy)Ur Screen: NOT DETECTED
Methadone Scn, Ur: NOT DETECTED
Opiate, Ur Screen: NOT DETECTED
Phencyclidine (PCP) Ur S: NOT DETECTED
Tricyclic, Ur Screen: NOT DETECTED

## 2019-06-19 LAB — ECHOCARDIOGRAM COMPLETE

## 2019-06-19 LAB — TROPONIN I (HIGH SENSITIVITY)
Troponin I (High Sensitivity): 3 ng/L (ref <18)
Troponin I (High Sensitivity): 3 ng/L (ref <18)

## 2019-06-19 LAB — ETHANOL: Alcohol, Ethyl (B): <10 mg/dL (ref <10)

## 2019-06-19 LAB — TSH: TSH: 3.182 u[IU]/mL (ref 0.350–4.500)

## 2019-06-19 LAB — SARS CORONAVIRUS 2 BY RT PCR (HOSPITAL ORDER, PERFORMED IN ~~LOC~~ HOSPITAL LAB): SARS Coronavirus 2: NEGATIVE

## 2019-06-19 MED ORDER — OXYCODONE HCL 5 MG PO TABS
5.0000 mg | ORAL_TABLET | Freq: Four times a day (QID) | ORAL | Status: AC | PRN
  Administered 2019-06-19 – 2019-06-20 (×3): 5 mg via ORAL
  Filled 2019-06-19 (×3): qty 1

## 2019-06-19 MED ORDER — SODIUM CHLORIDE 0.9% FLUSH
3.0000 mL | Freq: Two times a day (BID) | INTRAVENOUS | Status: DC
  Administered 2019-06-19 – 2019-06-20 (×2): 3 mL via INTRAVENOUS

## 2019-06-19 MED ORDER — ACETAMINOPHEN 650 MG RE SUPP: 650.0000 mg | Freq: Four times a day (QID) | RECTAL | Status: DC | PRN

## 2019-06-19 MED ORDER — ENOXAPARIN SODIUM 40 MG/0.4ML ~~LOC~~ SOLN
40.0000 mg | SUBCUTANEOUS | Status: DC
  Administered 2019-06-19 – 2019-06-20 (×2): 40 mg via SUBCUTANEOUS
  Filled 2019-06-19 (×3): qty 0.4

## 2019-06-19 MED ORDER — FENTANYL CITRATE (PF) 100 MCG/2ML IJ SOLN
50.0000 ug | Freq: Once | INTRAMUSCULAR | Status: AC
  Administered 2019-06-19: 50 ug via INTRAVENOUS
  Filled 2019-06-19: qty 2

## 2019-06-19 MED ORDER — IOHEXOL 350 MG/ML SOLN
75.0000 mL | Freq: Once | INTRAVENOUS | Status: AC | PRN
  Administered 2019-06-19: 75 mL via INTRAVENOUS

## 2019-06-19 MED ORDER — ONDANSETRON HCL 4 MG/2ML IJ SOLN
4.0000 mg | Freq: Once | INTRAMUSCULAR | Status: AC
  Administered 2019-06-19: 4 mg via INTRAVENOUS
  Filled 2019-06-19: qty 2

## 2019-06-19 MED ORDER — OXYCODONE-ACETAMINOPHEN 5-325 MG PO TABS
1.0000 | ORAL_TABLET | Freq: Once | ORAL | Status: AC
  Administered 2019-06-19: 1 via ORAL
  Filled 2019-06-19: qty 1

## 2019-06-19 MED ORDER — HYDROMORPHONE HCL 1 MG/ML PO LIQD
1.0000 mg | Freq: Once | ORAL | Status: AC
  Administered 2019-06-19: 21:00:00 1 mg via ORAL
  Filled 2019-06-19: qty 1

## 2019-06-19 MED ORDER — ONDANSETRON HCL 4 MG PO TABS: 4.0000 mg | ORAL_TABLET | Freq: Four times a day (QID) | ORAL | Status: DC | PRN

## 2019-06-19 MED ORDER — ONDANSETRON HCL 4 MG/2ML IJ SOLN: 4.0000 mg | Freq: Four times a day (QID) | INTRAMUSCULAR | Status: DC | PRN

## 2019-06-19 MED ORDER — ENOXAPARIN SODIUM 40 MG/0.4ML ~~LOC~~ SOLN: 40.0000 mg | SUBCUTANEOUS | Status: DC

## 2019-06-19 MED ORDER — PERFLUTREN LIPID MICROSPHERE
1.0000 mL | INTRAVENOUS | Status: AC | PRN
  Administered 2019-06-19: 2 mL via INTRAVENOUS
  Filled 2019-06-19: qty 10

## 2019-06-19 MED ORDER — KETOROLAC TROMETHAMINE 30 MG/ML IJ SOLN
30.0000 mg | Freq: Once | INTRAMUSCULAR | Status: AC
  Administered 2019-06-20: 02:00:00 30 mg via INTRAVENOUS
  Filled 2019-06-19: qty 1

## 2019-06-19 MED ORDER — ACETAMINOPHEN 325 MG PO TABS
650.0000 mg | ORAL_TABLET | Freq: Four times a day (QID) | ORAL | Status: DC | PRN
  Administered 2019-06-19 – 2019-06-20 (×4): 650 mg via ORAL
  Filled 2019-06-19 (×4): qty 2

## 2019-06-19 MED ORDER — LORAZEPAM 2 MG/ML IJ SOLN: 2.0000 mg | INTRAMUSCULAR | Status: DC | PRN

## 2019-06-19 NOTE — H&P (Signed)
History and Physical    Mary Wong IOE:[id number] DOB: [birth year] DOA: 06/19/2019  [facility]P: [facility]   Patient coming from: home  I have personally briefly reviewed patient's old medical records in [facility]  Chief Complaint: loss of consciousness with fall  HPI: Mary Wong is a 29 y.o. female with no significant past medical history who presented to the emergency room following a syncopal episode.  Patient had been seen in the emergency room the day prior for upper abdominal pain and discharged with a diagnosis of GERD.  She went to [facility] for a second opinion and they discharged home with a similar diagnosis placing her on omeprazole.  Not long after taking one of the omeprazole tablets while playing with her son, according to her husband she screamed out, suddenly collapsed and started shaking and foaming through the mouth.  Lasted about a minute and she woke up and was very confused she had no urinary incontinence.  Did not bite her tongue.  No history of seizures.  History taken mostly from the ED record as patient is very drowsy and unable to contribute ED Course: On arrival, vitals unremarkable.  Blood work unremarkable with alcohol level less than 10.  Head CT, maxillofacial CT as well as CTA chest without any acute findings.  Hospitalist consulted for admission.  Review of Systems: Limited as patient is very drowsy  Past Medical History:  Diagnosis Date  . Medical history non-contributory   . Ovarian cyst   . Scoliosis     Past Surgical History:  Procedure Laterality Date  . CESAREAN SECTION WITH BILATERAL TUBAL LIGATION N/A 02/29/2016   Procedure: CESAREAN SECTION WITH BILATERAL TUBAL LIGATION;  Surgeon: Elenora Fender Ward, MD;  Location: [facility] ORS;  Service: Obstetrics;  Laterality: N/A;  . NO PAST SURGERIES       reports that she has been smoking cigarettes. She has been smoking about 0.25 packs per day. She has never used smokeless tobacco.  She reports that she does not drink alcohol and does not use drugs.  No Known Allergies  No family history on file.   Prior to Admission medications   Medication Sig Start Date End Date Taking? Authorizing Provider  omeprazole (PRILOSEC OTC) 20 MG tablet Take 1 tablet (20 mg total) by mouth daily. 06/17/19 08/16/19 Yes Menshew, Charlesetta Ivory, PA-C  zolpidem (AMBIEN) 10 MG tablet Take 10 mg by mouth at bedtime. 05/13/19  Yes [provider]  butalbital-acetaminophen-caffeine (FIORICET, ESGIC) 50-325-40 MG tablet Take 1 tablet by mouth every 6 (six) hours as needed for headache. Patient not taking: Reported on 06/19/2019 05/01/17   Irean Hong, MD  Sertraline HCl (ZOLOFT PO) Take by mouth. Patient not taking: Reported on 06/19/2019    [provider]    Physical Exam: Vitals:   06/18/19 2159 06/19/19 0117 06/19/19 0200 06/19/19 0230  BP: 107/69 111/66 109/64 113/65  Pulse: 81 85 78 71  Resp: 16 16 16 13   Temp: 98.3 F (36.8 C)     TempSrc: Oral     SpO2: 98% 100% 98% 100%  Weight:      Height:         Vitals:   06/18/19 2159 06/19/19 0117 06/19/19 0200 06/19/19 0230  BP: 107/69 111/66 109/64 113/65  Pulse: 81 85 78 71  Resp: 16 16 16 13   Temp: 98.3 F (36.8 C)     TempSrc: Oral     SpO2: 98% 100% 98% 100%  Weight:      Height:          Constitutional: Lethargic, arousable but readily falls back asleep. Not in any apparent distress HEENT:      Head: Normocephalic and atraumatic.         Eyes: PERLA, EOMI, Conjunctivae are normal. Sclera is non-icteric.  Left periorbital bruise      Mouth/Throat: Mucous membranes are moist.       Neck: Supple with no signs of meningismus. Cardiovascular: Regular rate and rhythm. No murmurs, gallops, or rubs. 2+ symmetrical distal pulses are present . No JVD. No LE edema Respiratory: Respiratory effort normal .Lungs sounds clear bilaterally. No wheezes, crackles, or rhonchi.  Gastrointestinal: Soft, non tender, and non  distended with positive bowel sounds. No rebound or guarding. Genitourinary: No CVA tenderness. Musculoskeletal: Nontender with normal range of motion in all extremities. No edema, cyanosis, or erythema of extremities. Neurologic:  Face is symmetric. Moving all extremities. No gross focal neurologic deficits . Skin: Skin is warm, dry.  No rash or ulcers    Labs on Admission: I have personally reviewed following labs and imaging studies  CBC: Recent Labs  Lab 06/17/19 1631 06/18/19 2156  WBC 8.5 9.2  HGB 12.9 12.7  HCT 40.1 38.5  MCV 91.3 89.7  PLT 206 217   Basic Metabolic Panel: Recent Labs  Lab 06/17/19 1631 06/18/19 2156  NA 138 138  K 3.7 3.7  CL 107 105  CO2 24 27  GLUCOSE 86 101*  BUN 9 11  CREATININE 0.51 0.61  CALCIUM 8.8* 8.9   GFR: Estimated Creatinine Clearance: 108.4 mL/min (by C-G formula based on SCr of 0.61 mg/dL). Liver Function Tests: Recent Labs  Lab 06/17/19 1631  AST 12*  ALT 10  ALKPHOS 54  BILITOT 0.6  PROT 6.8  ALBUMIN 4.2   Recent Labs  Lab 06/17/19 1631  LIPASE 40   No results for input(s): AMMONIA in the last 168 hours. Coagulation Profile: No results for input(s): INR, PROTIME in the last 168 hours. Cardiac Enzymes: No results for input(s): CKTOTAL, CKMB, CKMBINDEX, TROPONINI in the last 168 hours. BNP (last 3 results) No results for input(s): PROBNP in the last 8760 hours. HbA1C: No results for input(s): HGBA1C in the last 72 hours. CBG: No results for input(s): GLUCAP in the last 168 hours. Lipid Profile: No results for input(s): CHOL, HDL, LDLCALC, TRIG, CHOLHDL, LDLDIRECT in the last 72 hours. Thyroid Function Tests: No results for input(s): TSH, T4TOTAL, FREET4, T3FREE, THYROIDAB in the last 72 hours. Anemia Panel: No results for input(s): VITAMINB12, FOLATE, FERRITIN, TIBC, IRON, RETICCTPCT in the last 72 hours. Urine analysis:    Component Value Date/Time   COLORURINE YELLOW (A) 06/17/2019 1632   APPEARANCEUR  HAZY (A) 06/17/2019 1632   APPEARANCEUR Cloudy 04/16/2014 1827   LABSPEC 1.019 06/17/2019 1632   LABSPEC 1.023 04/16/2014 1827   PHURINE 6.0 06/17/2019 1632   GLUCOSEU NEGATIVE 06/17/2019 1632   GLUCOSEU Negative 04/16/2014 1827   HGBUR NEGATIVE 06/17/2019 1632   BILIRUBINUR NEGATIVE 06/17/2019 1632   BILIRUBINUR Negative 04/16/2014 1827   KETONESUR NEGATIVE 06/17/2019 1632   PROTEINUR NEGATIVE 06/17/2019 1632   NITRITE NEGATIVE 06/17/2019 1632   LEUKOCYTESUR LARGE (A) 06/17/2019 1632   LEUKOCYTESUR 1+ 04/16/2014 1827    Radiological Exams on Admission: DG Chest 2 View  Result Date: 06/18/2019 CLINICAL DATA:  Syncope. EXAM: CHEST - 2 VIEW COMPARISON:  Radiograph 04/15/2018 FINDINGS: The cardiomediastinal contours are normal. The lungs are clear. Pulmonary vasculature  is normal. No consolidation, pleural effusion, or pneumothorax. No acute osseous abnormalities are seen. IMPRESSION: Negative radiographs of the chest. Electronically Signed   By: Keith Rake M.D.   On: 06/18/2019 22:19   CT HEAD WO CONTRAST  Result Date: 06/18/2019 CLINICAL DATA:  Seizure, nontraumatic (Age 65-40y) EXAM: CT HEAD WITHOUT CONTRAST TECHNIQUE: Contiguous axial images were obtained from the base of the skull through the vertex without intravenous contrast. COMPARISON:  Head CT 05/02/2017 FINDINGS: Brain: No intracranial hemorrhage, mass effect, or midline shift. No hydrocephalus. The basilar cisterns are patent. No evidence of territorial infarct or acute ischemia. No extra-axial or intracranial fluid collection. Vascular: No hyperdense vessel or unexpected calcification. Skull: No fracture or focal lesion. Sinuses/Orbits: Paranasal sinuses and mastoid air cells are clear. The visualized orbits are unremarkable. Other: None. IMPRESSION: Negative noncontrast head CT. No acute finding or explanation for seizure. Electronically Signed   By: Keith Rake M.D.   On: 06/18/2019 22:15   CT Angio Chest PE W and/or  Wo Contrast  Result Date: 06/19/2019 CLINICAL DATA:  Chest pain, altered mental status EXAM: CT ANGIOGRAPHY CHEST WITH CONTRAST TECHNIQUE: Multidetector CT imaging of the chest was performed using the standard protocol during bolus administration of intravenous contrast. Multiplanar CT image reconstructions and MIPs were obtained to evaluate the vascular anatomy. CONTRAST:  73mL OMNIPAQUE IOHEXOL 350 MG/ML SOLN COMPARISON:  None. FINDINGS: Cardiovascular: No filling defects in the pulmonary arteries to suggest pulmonary emboli. Heart is normal size. Aorta is normal caliber. Mediastinum/Nodes: No mediastinal, hilar, or axillary adenopathy. Trachea and esophagus are unremarkable. Thyroid unremarkable. Lungs/Pleura: Lungs are clear. No focal airspace opacities or suspicious nodules. No effusions. Upper Abdomen: Imaging into the upper abdomen shows no acute findings. Musculoskeletal: Chest wall soft tissues are unremarkable. No acute bony abnormality. Review of the MIP images confirms the above findings. IMPRESSION: No acute cardiopulmonary disease. Electronically Signed   By: Rolm Baptise M.D.   On: 06/19/2019 03:12   CT Maxillofacial Wo Contrast  Result Date: 06/19/2019 CLINICAL DATA:  Possible seizure EXAM: CT MAXILLOFACIAL WITHOUT CONTRAST TECHNIQUE: Multidetector CT imaging of the maxillofacial structures was performed. Multiplanar CT image reconstructions were also generated. COMPARISON:  None. FINDINGS: Osseous: No fracture or mandibular dislocation. No destructive process. Orbits: Negative. No traumatic or inflammatory finding. Sinuses: Slight mucosal thickening in the left maxillary sinus. No air-fluid levels. Soft tissues: Soft tissue swelling over the left orbit Limited intracranial: No acute findings IMPRESSION: No facial or orbital fracture. Soft tissue swelling over the left orbit. Electronically Signed   By: Rolm Baptise M.D.   On: 06/19/2019 03:14    EKG: Independently reviewed.    Assessment/Plan Active Problems:   Syncope and collapse   Observed seizure-like activity ([place]) -Patient presents with a syncopal episode, with shaking and frothing at the mouth.  Previously healthy -Syncope work-up to include cardiac monitoring and echocardiogram -EEG -Neurology consult -Ativan as needed seizure -Seizure precautions    Periorbital contusion of left eye -Pain control.  Cool compresses    DVT prophylaxis: Lovenox  Code Status: full code  Family Communication:  none  Disposition Plan: Back to previous home environment Consults called: Neurology Status: Observation    Athena Masse MD [facility]     06/19/2019, 5:12 AM

## 2019-06-19 NOTE — ED Notes (Signed)
Pt states having a headache at this moment and that she screamed/passed out and hit the coffee table. Patient states she thinks she had a seizure per fiance/daughter. Pt denies current nauseas. Pt states back is currently hurting.

## 2019-06-19 NOTE — Procedures (Addendum)
Patient Name: Mary Wong  MRN: [id number]  Epilepsy Attending: Charlsie Quest  Referring Physician/Provider: Dr Lindajo Royal Date: 06/19/2019 Duration: 21.19 mins  Patient history: 29 y.o. female withno significant past medical history who presented to the emergency room following a syncopal episode vs seizure event.  Level of alertness: Awake, asleep, comatose, lethargic  AEDs during EEG study:   Technical aspects: This EEG study was done with scalp electrodes positioned according to the 10-20 International system of electrode placement. Electrical activity was acquired at a sampling rate of 500Hz  and reviewed with a high frequency filter of 70Hz  and a low frequency filter of 1Hz . EEG data were recorded continuously and digitally stored.   Description: The posterior dominant rhythm consists of 9.5 Hz activity of moderate voltage (25-35 uV) seen predominantly in posterior head regions, symmetric and reactive to eye opening and eye closing. Sleep was characterized by vertex waves, sleep spindles (12 to 14 Hz), maximal frontocentral region. EEG showed generalized, maximal bifrontal polyspike and wave at 4hz , more frequently during sleep.  Physiology photic driving was not seen during photic stimulation.  Hyperventilation was not performed.     ABNORMALITY -Polyspike and wave, generalized   IMPRESSION: This study showed evidence of generalized epilepsy, most like Juvenile Myoclonic epilepsy but can also be secondary to focal epilepsy with frontal onset. No seizures were seen throughout the recording.   Dr was notified.         Mary Wong 

## 2019-06-19 NOTE — ED Notes (Signed)
Returned to room.  Monitor nsr.  Says she has body aches all over.

## 2019-06-19 NOTE — ED Notes (Signed)
Patient went to bathroom to get urine specimen, but she forgot to get the specimen.  Will try later.  Back to room, on monitor nsr.

## 2019-06-19 NOTE — Consult Note (Addendum)
Reason for Consult: seizure activity  Requesting Physician: Dr. Myriam Forehand   CC: seizure type activity     HPI: Mary Wong is an 29 y.o. female  with no significant past medical history who presented to the emergency room following a syncopal episode vs seizure event.  Patient had been seen in the emergency room the day prior for upper abdominal pain and discharged with a diagnosis of GERD.  She went to [facility] for a second opinion and they discharged home with a similar diagnosis placing her on omeprazole.  Not long after taking one of the omeprazole tablets while playing with her son, then gave her 53 yr old son to her fiance after which she screamed out, suddenly collapsed and started shaking and foaming through the mouth.  Lasted about a minute and she woke up and was very confused she had no urinary incontinence.  Did not bite her tongue.  No history of seizures prior.  Pt has a bruise over her L eye  Past Medical History:  Diagnosis Date  . Medical history non-contributory   . Ovarian cyst   . Scoliosis     Past Surgical History:  Procedure Laterality Date  . CESAREAN SECTION WITH BILATERAL TUBAL LIGATION N/A 02/29/2016   Procedure: CESAREAN SECTION WITH BILATERAL TUBAL LIGATION;  Surgeon: Elenora Fender Ward, MD;  Location: [facility] ORS;  Service: Obstetrics;  Laterality: N/A;  . NO PAST SURGERIES      No family history on file.  Social History:  reports that she has been smoking cigarettes. She has been smoking about 0.25 packs per day. She has never used smokeless tobacco. She reports that she does not drink alcohol and does not use drugs.  No Known Allergies  Medications: I have reviewed the patient's current medications.  ROS: History obtained from the patient  General ROS: negative for - chills, fatigue, fever, night sweats, weight gain or weight loss Psychological ROS: negative for - behavioral disorder, hallucinations, memory difficulties, mood swings or suicidal  ideation Ophthalmic ROS: negative for - blurry vision, double vision, eye pain or loss of vision ENT ROS: negative for - epistaxis, nasal discharge, oral lesions, sore throat, tinnitus or vertigo Allergy and Immunology ROS: negative for - hives or itchy/watery eyes Hematological and Lymphatic ROS: negative for - bleeding problems, bruising or swollen lymph nodes Endocrine ROS: negative for - galactorrhea, hair pattern changes, polydipsia/polyuria or temperature intolerance Respiratory ROS: negative for - cough, hemoptysis, shortness of breath or wheezing Cardiovascular ROS: negative for - chest pain, dyspnea on exertion, edema or irregular heartbeat Gastrointestinal ROS: negative for - abdominal pain, diarrhea, hematemesis, nausea/vomiting or stool incontinence Genito-Urinary ROS: negative for - dysuria, hematuria, incontinence or urinary frequency/urgency Musculoskeletal ROS: negative for - joint swelling or muscular weakness Neurological ROS: as noted in HPI Dermatological ROS: negative for rash and skin lesion changes  Physical Examination: Blood pressure 109/67, pulse (!) 56, temperature 98.3 F (36.8 C), temperature source Oral, resp. rate 14, height 5\' 9"  (1.753 m), weight 73.9 kg, last menstrual period 05/23/2019, SpO2 100 %, unknown if currently breastfeeding.    Neurological Examination   Mental Status: Alert, oriented, thought content appropriate.  Speech fluent without evidence of aphasia.  Able to follow 3 step commands without difficulty. Cranial Nerves: II: Discs flat bilaterally; Visual fields grossly normal, pupils equal, round, reactive to light and accommodation III,IV, VI: ptosis not present, extra-ocular motions intact bilaterally V,VII: smile symmetric, facial light touch sensation normal bilaterally VIII: hearing normal bilaterally IX,X: gag reflex  present XI: bilateral shoulder shrug XII: midline tongue extension Motor: Right : Upper extremity   5/5    Left:      Upper extremity   5/5  Lower extremity   5/5     Lower extremity   5/5 Tone and bulk:normal tone throughout; no atrophy noted Sensory: Pinprick and light touch intact throughout, bilaterally Deep Tendon Reflexes: 2+ and symmetric throughout Plantars: Right: downgoing   Left: downgoing Cerebellar: normal finger-to-nose, normal rapid alternating movements and normal heel-to-shin test Gait: not tested      Laboratory Studies:   Basic Metabolic Panel: Recent Labs  Lab 06/17/19 1631 06/18/19 2156  NA 138 138  K 3.7 3.7  CL 107 105  CO2 24 27  GLUCOSE 86 101*  BUN 9 11  CREATININE 0.51 0.61  CALCIUM 8.8* 8.9    Liver Function Tests: Recent Labs  Lab 06/17/19 1631  AST 12*  ALT 10  ALKPHOS 54  BILITOT 0.6  PROT 6.8  ALBUMIN 4.2   Recent Labs  Lab 06/17/19 1631  LIPASE 40   No results for input(s): AMMONIA in the last 168 hours.  CBC: Recent Labs  Lab 06/17/19 1631 06/18/19 2156  WBC 8.5 9.2  HGB 12.9 12.7  HCT 40.1 38.5  MCV 91.3 89.7  PLT 206 217    Cardiac Enzymes: No results for input(s): CKTOTAL, CKMB, CKMBINDEX, TROPONINI in the last 168 hours.  BNP: Invalid input(s): POCBNP  CBG: No results for input(s): GLUCAP in the last 168 hours.  Microbiology: Results for orders placed or performed during the hospital encounter of 06/19/19  SARS Coronavirus 2 by RT PCR (hospital order, performed in [facility] hospital lab) Nasopharyngeal Nasopharyngeal Swab     Status: None   Collection Time: 06/19/19  9:25 AM   Specimen: Nasopharyngeal Swab  Result Value Ref Range Status   SARS Coronavirus 2 NEGATIVE NEGATIVE Final    Comment: (NOTE) SARS-CoV-2 target nucleic acids are NOT DETECTED.  The SARS-CoV-2 RNA is generally detectable in upper and lower respiratory specimens during the acute phase of infection. The lowest concentration of SARS-CoV-2 viral copies this assay can detect is 250 copies / mL. A negative result does not preclude SARS-CoV-2  infection and should not be used as the sole basis for treatment or other patient management decisions.  A negative result may occur with improper specimen collection / handling, submission of specimen other than nasopharyngeal swab, presence of viral mutation(s) within the areas targeted by this assay, and inadequate number of viral copies (<250 copies / mL). A negative result must be combined with clinical observations, patient history, and epidemiological information.  Fact Sheet for Patients:   BoilerBrush.com.cy  Fact Sheet for Healthcare Providers: https://pope.com/  This test is not yet approved or  cleared by the [place] FDA and has been authorized for detection and/or diagnosis of SARS-CoV-2 by FDA under an Emergency Use Authorization (EUA).  This EUA will remain in effect (meaning this test can be used) for the duration of the COVID-19 declaration under Section 564(b)(1) of the Act, 21 U.S.C. section 360bbb-3(b)(1), unless the authorization is terminated or revoked sooner.  Performed at [facility], [address] [place], [place] [address]     Coagulation Studies: No results for input(s): LABPROT, INR in the last 72 hours.  Urinalysis:  Recent Labs  Lab 06/17/19 1632  COLORURINE YELLOW*  LABSPEC 1.019  PHURINE 6.0  GLUCOSEU NEGATIVE  HGBUR NEGATIVE  BILIRUBINUR NEGATIVE  KETONESUR NEGATIVE  PROTEINUR NEGATIVE  NITRITE NEGATIVE  LEUKOCYTESUR LARGE*    Lipid Panel:  No results found for: CHOL, TRIG, HDL, CHOLHDL, VLDL, LDLCALC  HgbA1C: No results found for: HGBA1C  Urine Drug Screen:      Component Value Date/Time   LABOPIA NONE DETECTED 11/29/2015 2142   COCAINSCRNUR NONE DETECTED 11/29/2015 2142   LABBENZ NONE DETECTED 11/29/2015 2142   AMPHETMU NONE DETECTED 11/29/2015 2142   THCU NONE DETECTED 11/29/2015 2142   LABBARB NONE DETECTED 11/29/2015 2142    Alcohol Level:  Recent  Labs  Lab 06/19/19 0159  ETH <10    Other results: EKG: normal EKG, normal sinus rhythm, unchanged from previous tracings.  Imaging: DG Chest 2 View  Result Date: 06/18/2019 CLINICAL DATA:  Syncope. EXAM: CHEST - 2 VIEW COMPARISON:  Radiograph 04/15/2018 FINDINGS: The cardiomediastinal contours are normal. The lungs are clear. Pulmonary vasculature is normal. No consolidation, pleural effusion, or pneumothorax. No acute osseous abnormalities are seen. IMPRESSION: Negative radiographs of the chest. Electronically Signed   By: Keith Rake M.D.   On: 06/18/2019 22:19   CT HEAD WO CONTRAST  Result Date: 06/18/2019 CLINICAL DATA:  Seizure, nontraumatic (Age 80-40y) EXAM: CT HEAD WITHOUT CONTRAST TECHNIQUE: Contiguous axial images were obtained from the base of the skull through the vertex without intravenous contrast. COMPARISON:  Head CT 05/02/2017 FINDINGS: Brain: No intracranial hemorrhage, mass effect, or midline shift. No hydrocephalus. The basilar cisterns are patent. No evidence of territorial infarct or acute ischemia. No extra-axial or intracranial fluid collection. Vascular: No hyperdense vessel or unexpected calcification. Skull: No fracture or focal lesion. Sinuses/Orbits: Paranasal sinuses and mastoid air cells are clear. The visualized orbits are unremarkable. Other: None. IMPRESSION: Negative noncontrast head CT. No acute finding or explanation for seizure. Electronically Signed   By: Keith Rake M.D.   On: 06/18/2019 22:15   CT Angio Chest PE W and/or Wo Contrast  Result Date: 06/19/2019 CLINICAL DATA:  Chest pain, altered mental status EXAM: CT ANGIOGRAPHY CHEST WITH CONTRAST TECHNIQUE: Multidetector CT imaging of the chest was performed using the standard protocol during bolus administration of intravenous contrast. Multiplanar CT image reconstructions and MIPs were obtained to evaluate the vascular anatomy. CONTRAST:  65mL OMNIPAQUE IOHEXOL 350 MG/ML SOLN COMPARISON:   None. FINDINGS: Cardiovascular: No filling defects in the pulmonary arteries to suggest pulmonary emboli. Heart is normal size. Aorta is normal caliber. Mediastinum/Nodes: No mediastinal, hilar, or axillary adenopathy. Trachea and esophagus are unremarkable. Thyroid unremarkable. Lungs/Pleura: Lungs are clear. No focal airspace opacities or suspicious nodules. No effusions. Upper Abdomen: Imaging into the upper abdomen shows no acute findings. Musculoskeletal: Chest wall soft tissues are unremarkable. No acute bony abnormality. Review of the MIP images confirms the above findings. IMPRESSION: No acute cardiopulmonary disease. Electronically Signed   By: Rolm Baptise M.D.   On: 06/19/2019 03:12   CT Maxillofacial Wo Contrast  Result Date: 06/19/2019 CLINICAL DATA:  Possible seizure EXAM: CT MAXILLOFACIAL WITHOUT CONTRAST TECHNIQUE: Multidetector CT imaging of the maxillofacial structures was performed. Multiplanar CT image reconstructions were also generated. COMPARISON:  None. FINDINGS: Osseous: No fracture or mandibular dislocation. No destructive process. Orbits: Negative. No traumatic or inflammatory finding. Sinuses: Slight mucosal thickening in the left maxillary sinus. No air-fluid levels. Soft tissues: Soft tissue swelling over the left orbit Limited intracranial: No acute findings IMPRESSION: No facial or orbital fracture. Soft tissue swelling over the left orbit. Electronically Signed   By: Rolm Baptise M.D.   On: 06/19/2019 03:14     Assessment/Plan:  29 y.o. female  with no significant past medical history who presented to the emergency room following a syncopal episode vs seizure event.  Patient had been seen in the emergency room the day prior for upper abdominal pain and discharged with a diagnosis of GERD.  She went to [facility] for a second opinion and they discharged home with a similar diagnosis placing her on omeprazole.  Not long after taking one of the omeprazole tablets while playing with  her son, then gave her 44 yr old son to her fiance after which she screamed out, suddenly collapsed and started shaking and foaming through the mouth.  Lasted about a minute and she woke up and was very confused she had no urinary incontinence.  Did not bite her tongue.  No history of seizures prior.  Pt has a bruise over her L eye  - Denies any new stress in her life  - EEG ordered - MRI brain w/ and without contrast ordered - will hold off anti epileptics unless abnormality on the tests above  Addendum: MRI no acute abnormalities EEG pending 06/19/2019, 11:47 AM

## 2019-06-19 NOTE — Plan of Care (Signed)

## 2019-06-19 NOTE — Progress Notes (Signed)
She complains of headache and bilateral hip pain.  Vital signs are stable though heart rate and blood pressure are on the low side.  Physical exam is significant for left periorbital bruising from fall after seizure.  Patient has been evaluated by neurologist.  MRI brain was unremarkable.  EEG is pending.  Analgesics as needed for pain.  Monitor overnight.

## 2019-06-19 NOTE — Progress Notes (Signed)
*  PRELIMINARY RESULTS* Echocardiogram 2D Echocardiogram has been performed.  Cristela Blue 06/19/2019, 2:39 PM

## 2019-06-19 NOTE — ED Provider Notes (Signed)
[facility] Emergency Department Provider Note  ____________________________________________  Time seen: Approximately 2:35 AM  I have reviewed the triage vital signs and the nursing notes.   HISTORY  Chief Complaint Loss of Consciousness   HPI Mary Wong is a 29 y.o. female with no significant past medical history who presents for evaluation of syncope.  Patient was seen here yesterday for upper abdominal pain and was discharged with a diagnosis of GERD.  She reports that this morning she wanted to make sure the diagnosis was correct so she went to [facility].  She was given the same diagnosis and discharged home on  omeprazole.  She filled her prescription on her way home.  When she got home she ate and took one of the pills.  About 30 minutes later she was playing with her son when she had a syncopal event.  According to her fianc patient let out a loud scream, collapsed to the ground and started shaking and foaming through the mouth.  The whole episode lasted about a minute.  As soon as she woke up she was very agitated and scratching him but recognize him and knew where she was.  Patient reports that she does not remember anything other than playing with her son and waking up on the couch with the paramedics at bedside.  She hit her face onto the coffee table.  She is complaining of left-sided face and jaw pain.  She also complaining of right tongue pain.  No urinary or bowel incontinence, no prior history of seizures.  Patient denies drugs or alcohol use.  She denies neck pain.  She is complaining of mild left-sided upper back pain.  She denies any recent illness.  She denies any history of sudden death.  She reports that the pain in her abdomen that she was having yesterday has subsided but she has had some intermittent sharp chest pains today.  Past Medical History:  Diagnosis Date  . Medical history non-contributory   . Ovarian cyst   . Scoliosis      Patient Active Problem List   Diagnosis Date Noted  . Preterm labor in third trimester with preterm delivery 02/29/2016  . Malpresentation of fetus, fetus 2 of multiple gestation 02/29/2016  . Discordant fetal growth in twin gestation, third trimester 02/29/2016  . Depression affecting pregnancy 02/29/2016  . Anemia affecting pregnancy 02/29/2016  . Smoking (tobacco) complicating pregnancy, third trimester 02/29/2016  . History of preterm delivery, currently pregnant 02/29/2016  . Insufficient prenatal care 02/29/2016  . Twin newborn resulting from both spontaneous ovulation and conception, delivered by cesarean in hospital 02/29/2016  . S/P cesarean section 02/29/2016  . Twin pregnancy, twins dichorionic and diamniotic 11/06/2015    Past Surgical History:  Procedure Laterality Date  . CESAREAN SECTION WITH BILATERAL TUBAL LIGATION N/A 02/29/2016   Procedure: CESAREAN SECTION WITH BILATERAL TUBAL LIGATION;  Surgeon: Elenora Fender Ward, MD;  Location: [facility] ORS;  Service: Obstetrics;  Laterality: N/A;  . NO PAST SURGERIES      Prior to Admission medications   Medication Sig Start Date End Date Taking? Authorizing Provider  butalbital-acetaminophen-caffeine (FIORICET, ESGIC) [phone] MG tablet Take 1 tablet by mouth every 6 (six) hours as needed for headache. 05/01/17   Irean Hong, MD  ferrous sulfate 325 (65 FE) MG EC tablet Take 325 mg by mouth 3 (three) times daily with meals.    [provider]  omeprazole (PRILOSEC OTC) 20 MG tablet Take 1 tablet (20  mg total) by mouth daily. 06/17/19 08/16/19  Menshew, Dannielle Karvonen, PA-C  oxyCODONE (OXY IR/ROXICODONE) 5 MG immediate release tablet Take 1 tablet (5 mg total) by mouth every 4 (four) hours as needed (pain scale 4-7). 03/03/16   Catheryn Bacon, CNM  Prenatal Vit-Fe Fumarate-FA (MULTIVITAMIN-PRENATAL) 27-0.8 MG TABS tablet Take 1 tablet by mouth daily at 12 noon.    [provider]  Sertraline HCl (ZOLOFT PO) Take by  mouth.    [provider]  simethicone (GAS-X) 80 MG chewable tablet Chew 1 tablet (80 mg total) by mouth every 6 (six) hours as needed for flatulence. 03/03/16   Catheryn Bacon, CNM    Allergies Patient has no known allergies.  No family history on file.  Social History Social History   Tobacco Use  . Smoking status: Current Every Day Smoker    Packs/day: 0.25    Types: Cigarettes  . Smokeless tobacco: Never Used  Substance Use Topics  . Alcohol use: No  . Drug use: No    Review of Systems  Constitutional: Negative for fever. + syncope Eyes: Negative for visual changes. ENT: Negative for sore throat. + face pain Neck: No neck pain  Cardiovascular: Negative for chest pain. Respiratory: Negative for shortness of breath. Gastrointestinal: Negative for abdominal pain, vomiting or diarrhea. Genitourinary: Negative for dysuria. Musculoskeletal: Negative for back pain. + chest pain Skin: Negative for rash. Neurological: Negative for headaches, weakness or numbness. Psych: No SI or HI  ____________________________________________   PHYSICAL EXAM:  VITAL SIGNS: ED Triage Vitals  Enc Vitals Group     BP 06/18/19 2159 107/69     Pulse Rate 06/18/19 2159 81     Resp 06/18/19 2159 16     Temp 06/18/19 2159 98.3 F (36.8 C)     Temp Source 06/18/19 2159 Oral     SpO2 06/18/19 2159 98 %     Weight 06/18/19 2152 163 lb (73.9 kg)     Height 06/18/19 2152 5\' 9"  (1.753 m)     Head Circumference --      Peak Flow --      Pain Score 06/18/19 2152 6     Pain Loc --      Pain Edu? --      Excl. in [place]? --     Constitutional: Alert and oriented. Well appearing and in no apparent distress. HEENT:      Head: Normocephalic and atraumatic.         Face: Tender to palpation the left jaw region      Eyes: Conjunctivae are normal. Sclera is non-icteric.  Patient has periorbital bruising on the left      Mouth/Throat: Mucous membranes are moist. Abrasion to the R side of  the tongue with no laceration      Ear: No hemotympanum      Neck: Supple with no signs of meningismus. Cardiovascular: Regular rate and rhythm. No murmurs, gallops, or rubs. 2+ symmetrical distal pulses are present in all extremities. No JVD. Respiratory: Normal respiratory effort. Lungs are clear to auscultation bilaterally.  Gastrointestinal: Soft, non tender. Musculoskeletal: Nontender with normal range of motion in all extremities.  No CT and L-spine tenderness.  No edema, cyanosis, or erythema of extremities. Neurologic: Normal speech and language. Face is symmetric. Moving all extremities. No gross focal neurologic deficits are appreciated. Skin: Skin is warm, dry and intact. No rash noted. Psychiatric: Mood and affect are normal. Speech and behavior are normal.  ____________________________________________  LABS (all labs ordered are listed, but only abnormal results are displayed)  Labs Reviewed  BASIC METABOLIC PANEL - Abnormal; Notable for the following components:      Result Value   Glucose, Bld 101 (*)    All other components within normal limits  CBC  ETHANOL  TROPONIN I (HIGH SENSITIVITY)  TROPONIN I (HIGH SENSITIVITY)   ____________________________________________  EKG  ED ECG REPORT I, Nita Sickle, the attending physician, personally viewed and interpreted this ECG.  Normal sinus rhythm, normal intervals, normal axis, no STE or depressions, no evidence of HOCM, AV block, delta wave, ARVD, prolonged QTc, WPW, or Brugada.   ____________________________________________  RADIOLOGY  I have personally reviewed the images performed during this visit and I agree with the Radiologist's read.   Interpretation by Radiologist:  DG Chest 2 View  Result Date: 06/18/2019 CLINICAL DATA:  Syncope. EXAM: CHEST - 2 VIEW COMPARISON:  Radiograph 04/15/2018 FINDINGS: The cardiomediastinal contours are normal. The lungs are clear. Pulmonary vasculature is normal. No  consolidation, pleural effusion, or pneumothorax. No acute osseous abnormalities are seen. IMPRESSION: Negative radiographs of the chest. Electronically Signed   By: Narda Rutherford M.D.   On: 06/18/2019 22:19   CT HEAD WO CONTRAST  Result Date: 06/18/2019 CLINICAL DATA:  Seizure, nontraumatic (Age 62-40y) EXAM: CT HEAD WITHOUT CONTRAST TECHNIQUE: Contiguous axial images were obtained from the base of the skull through the vertex without intravenous contrast. COMPARISON:  Head CT 05/02/2017 FINDINGS: Brain: No intracranial hemorrhage, mass effect, or midline shift. No hydrocephalus. The basilar cisterns are patent. No evidence of territorial infarct or acute ischemia. No extra-axial or intracranial fluid collection. Vascular: No hyperdense vessel or unexpected calcification. Skull: No fracture or focal lesion. Sinuses/Orbits: Paranasal sinuses and mastoid air cells are clear. The visualized orbits are unremarkable. Other: None. IMPRESSION: Negative noncontrast head CT. No acute finding or explanation for seizure. Electronically Signed   By: Narda Rutherford M.D.   On: 06/18/2019 22:15   CT Angio Chest PE W and/or Wo Contrast  Result Date: 06/19/2019 CLINICAL DATA:  Chest pain, altered mental status EXAM: CT ANGIOGRAPHY CHEST WITH CONTRAST TECHNIQUE: Multidetector CT imaging of the chest was performed using the standard protocol during bolus administration of intravenous contrast. Multiplanar CT image reconstructions and MIPs were obtained to evaluate the vascular anatomy. CONTRAST:  71mL OMNIPAQUE IOHEXOL 350 MG/ML SOLN COMPARISON:  None. FINDINGS: Cardiovascular: No filling defects in the pulmonary arteries to suggest pulmonary emboli. Heart is normal size. Aorta is normal caliber. Mediastinum/Nodes: No mediastinal, hilar, or axillary adenopathy. Trachea and esophagus are unremarkable. Thyroid unremarkable. Lungs/Pleura: Lungs are clear. No focal airspace opacities or suspicious nodules. No effusions.  Upper Abdomen: Imaging into the upper abdomen shows no acute findings. Musculoskeletal: Chest wall soft tissues are unremarkable. No acute bony abnormality. Review of the MIP images confirms the above findings. IMPRESSION: No acute cardiopulmonary disease. Electronically Signed   By: Charlett Nose M.D.   On: 06/19/2019 03:12   CT Maxillofacial Wo Contrast  Result Date: 06/19/2019 CLINICAL DATA:  Possible seizure EXAM: CT MAXILLOFACIAL WITHOUT CONTRAST TECHNIQUE: Multidetector CT imaging of the maxillofacial structures was performed. Multiplanar CT image reconstructions were also generated. COMPARISON:  None. FINDINGS: Osseous: No fracture or mandibular dislocation. No destructive process. Orbits: Negative. No traumatic or inflammatory finding. Sinuses: Slight mucosal thickening in the left maxillary sinus. No air-fluid levels. Soft tissues: Soft tissue swelling over the left orbit Limited intracranial: No acute findings IMPRESSION: No facial or orbital fracture. Soft tissue swelling over  the left orbit. Electronically Signed   By: Charlett Nose M.D.   On: 06/19/2019 03:14     ____________________________________________   PROCEDURES  Procedure(s) performed:yes .1-3 Lead EKG Interpretation Performed by: Nita Sickle, MD Authorized by: Nita Sickle, MD     Interpretation: normal     ECG rate assessment: normal     Rhythm: sinus rhythm     Ectopy: none     Critical Care performed:  None ____________________________________________   INITIAL IMPRESSION / ASSESSMENT AND PLAN / ED COURSE  29 y.o. female with no significant past medical history who presents for evaluation of LOC (syncope vs seizure).  Patient is well-appearing and in no distress, she has tenderness to palpation of the left jaw and periorbital hematoma from her trauma.  No other acute traumatic findings on exam.  Heart regular rate and rhythm with no murmurs.  EKG showing no evidence of dysrhythmias or ischemia.   Patient was placed on telemetry for monitor for any cardiac dysrhythmias.  At this time I am more concerned for syncope versus seizure since patient has no postictal phase, no urinary or bowel incontinence.  The fact the patient had no warning prior to syncope does make me more concerned for cardiac event.  We will monitor her closely on telemetry.  CT head, face are pending.  We will also send patient for CT angio of the chest that she is she is complaining of chest pain in the setting of syncope to rule out a PE.  History gathered from patient and her fianc.  Old medical records reviewed.  Will give IV fentanyl and Zofran for symptom relief.  _________________________ 3:35 AM on 06/19/2019 -----------------------------------------  CT head and maxillofacial with no acute traumatic injuries, confirmed by radiology.  CT of the chest with no PE or any acute findings, confirmed by radiology.  Patient was monitored on telemetry with no dysrhythmias.  However with the sudden syncopal event with no prewarning symptoms I am concerned for cardiac event.  Will discuss with the hospitalist for admission for monitoring.    _____________________________________________ Please note:  Patient was evaluated in Emergency Department today for the symptoms described in the history of present illness. Patient was evaluated in the context of the global COVID-19 pandemic, which necessitated consideration that the patient might be at risk for infection with the SARS-CoV-2 virus that causes COVID-19. Institutional protocols and algorithms that pertain to the evaluation of patients at risk for COVID-19 are in a state of rapid change based on information released by regulatory bodies including the CDC and federal and state organizations. These policies and algorithms were followed during the patient's care in the ED.  Some ED evaluations and interventions may be delayed as a result of limited staffing during the  pandemic.   [place] Controlled Substance Database was reviewed by me. ____________________________________________   FINAL CLINICAL IMPRESSION(S) / ED DIAGNOSES   Final diagnoses:  Syncope and collapse  Blunt trauma of face, initial encounter      NEW MEDICATIONS STARTED DURING THIS VISIT:  ED Discharge Orders    None       Note:  This document was prepared using Dragon voice recognition software and may include unintentional dictation errors.    Don Perking, [place], MD 06/19/19 [phone]

## 2019-06-19 NOTE — ED Notes (Signed)
Patient is resting on left side with eyes closed.  Respirations even and non labored.

## 2019-06-19 NOTE — Progress Notes (Signed)
eeg completed ° °

## 2019-06-20 DIAGNOSIS — R55 Syncope and collapse: Secondary | ICD-10-CM | POA: Diagnosis not present

## 2019-06-20 DIAGNOSIS — S0512XA Contusion of eyeball and orbital tissues, left eye, initial encounter: Secondary | ICD-10-CM

## 2019-06-20 DIAGNOSIS — Z79899 Other long term (current) drug therapy: Secondary | ICD-10-CM | POA: Diagnosis not present

## 2019-06-20 DIAGNOSIS — G40B09 Juvenile myoclonic epilepsy, not intractable, without status epilepticus: Secondary | ICD-10-CM | POA: Diagnosis present

## 2019-06-20 DIAGNOSIS — Y9389 Activity, other specified: Secondary | ICD-10-CM | POA: Diagnosis not present

## 2019-06-20 DIAGNOSIS — F17210 Nicotine dependence, cigarettes, uncomplicated: Secondary | ICD-10-CM | POA: Diagnosis present

## 2019-06-20 DIAGNOSIS — S0993XA Unspecified injury of face, initial encounter: Secondary | ICD-10-CM

## 2019-06-20 DIAGNOSIS — Y92009 Unspecified place in unspecified non-institutional (private) residence as the place of occurrence of the external cause: Secondary | ICD-10-CM | POA: Diagnosis not present

## 2019-06-20 DIAGNOSIS — M26622 Arthralgia of left temporomandibular joint: Secondary | ICD-10-CM | POA: Diagnosis present

## 2019-06-20 DIAGNOSIS — S0012XA Contusion of left eyelid and periocular area, initial encounter: Secondary | ICD-10-CM | POA: Diagnosis present

## 2019-06-20 DIAGNOSIS — R569 Unspecified convulsions: Secondary | ICD-10-CM | POA: Diagnosis not present

## 2019-06-20 DIAGNOSIS — K219 Gastro-esophageal reflux disease without esophagitis: Secondary | ICD-10-CM | POA: Diagnosis present

## 2019-06-20 DIAGNOSIS — R079 Chest pain, unspecified: Secondary | ICD-10-CM | POA: Diagnosis present

## 2019-06-20 DIAGNOSIS — R519 Headache, unspecified: Secondary | ICD-10-CM | POA: Diagnosis present

## 2019-06-20 DIAGNOSIS — W19XXXA Unspecified fall, initial encounter: Secondary | ICD-10-CM | POA: Diagnosis present

## 2019-06-20 DIAGNOSIS — Z20822 Contact with and (suspected) exposure to covid-19: Secondary | ICD-10-CM | POA: Diagnosis present

## 2019-06-20 LAB — GLUCOSE, CAPILLARY: Glucose-Capillary: 101 mg/dL — ABNORMAL HIGH (ref 70–99)

## 2019-06-20 LAB — HIV ANTIBODY (ROUTINE TESTING W REFLEX): HIV Screen 4th Generation wRfx: NONREACTIVE

## 2019-06-20 MED ORDER — PANTOPRAZOLE SODIUM 40 MG PO TBEC
40.0000 mg | DELAYED_RELEASE_TABLET | Freq: Every day | ORAL | Status: DC
  Administered 2019-06-21: 40 mg via ORAL
  Filled 2019-06-20: qty 1

## 2019-06-20 MED ORDER — ACETAMINOPHEN 325 MG PO TABS
650.0000 mg | ORAL_TABLET | Freq: Once | ORAL | Status: AC
  Administered 2019-06-20: 650 mg via ORAL
  Filled 2019-06-20: qty 2

## 2019-06-20 MED ORDER — IBUPROFEN 400 MG PO TABS
600.0000 mg | ORAL_TABLET | Freq: Four times a day (QID) | ORAL | Status: DC | PRN
  Administered 2019-06-20: 23:00:00 600 mg via ORAL
  Filled 2019-06-20: qty 2

## 2019-06-20 MED ORDER — LEVETIRACETAM 500 MG PO TABS
500.0000 mg | ORAL_TABLET | Freq: Two times a day (BID) | ORAL | Status: DC
  Administered 2019-06-20 – 2019-06-21 (×3): 500 mg via ORAL
  Filled 2019-06-20 (×4): qty 1

## 2019-06-20 NOTE — Progress Notes (Signed)
Subjective: No further seizure activity noted.    Objective: Current vital signs: BP (!) 87/50 (BP Location: Left Arm)   Pulse (!) 46   Temp 97.8 F (36.6 C) (Oral)   Resp 16   Ht 5\' 9"  (1.753 m)   Wt 73.9 kg   LMP 05/23/2019 (Exact Date)   SpO2 99%   BMI 24.07 kg/m  Vital signs in last 24 hours: Temp:  [97.8 F (36.6 C)-98.3 F (36.8 C)] 97.8 F (36.6 C) (06/16 0808) Pulse Rate:  [46-83] 46 (06/16 0808) Resp:  [10-29] 16 (06/16 0815) BP: (79-121)/(38-73) 87/50 (06/16 0815) SpO2:  [95 %-100 %] 99 % (06/16 0815)  Intake/Output from previous day: 06/15 0701 - 06/16 0700 In: 120 [P.O.:120] Out: -  Intake/Output this shift: No intake/output data recorded. Nutritional status:  Diet Order            Diet regular Room service appropriate? Yes; Fluid consistency: Thin  Diet effective now                 Neurologic Exam: Mental Status: Alert, oriented, thought content appropriate.  Speech fluent without evidence of aphasia.  Able to follow 3 step commands without difficulty. Cranial Nerves: II: Discs flat bilaterally; Visual fields grossly normal, pupils equal, round, reactive to light and accommodation III,IV, VI: ptosis not present, extra-ocular motions intact bilaterally.  Left periorbital hematoma V,VII: smile symmetric, facial light touch sensation normal bilaterally VIII: hearing normal bilaterally IX,X: gag reflex present XI: bilateral shoulder shrug XII: midline tongue extension Motor: 5/5 throughout Sensory: Pinprick and light touch intact throughout, bilaterally   Lab Results: Basic Metabolic Panel: Recent Labs  Lab 06/17/19 1631 06/18/19 2156  NA 138 138  K 3.7 3.7  CL 107 105  CO2 24 27  GLUCOSE 86 101*  BUN 9 11  CREATININE 0.51 0.61  CALCIUM 8.8* 8.9    Liver Function Tests: Recent Labs  Lab 06/17/19 1631  AST 12*  ALT 10  ALKPHOS 54  BILITOT 0.6  PROT 6.8  ALBUMIN 4.2   Recent Labs  Lab 06/17/19 1631  LIPASE 40   No  results for input(s): AMMONIA in the last 168 hours.  CBC: Recent Labs  Lab 06/17/19 1631 06/18/19 2156  WBC 8.5 9.2  HGB 12.9 12.7  HCT 40.1 38.5  MCV 91.3 89.7  PLT 206 217    Cardiac Enzymes: No results for input(s): CKTOTAL, CKMB, CKMBINDEX, TROPONINI in the last 168 hours.  Lipid Panel: No results for input(s): CHOL, TRIG, HDL, CHOLHDL, VLDL, LDLCALC in the last 168 hours.  CBG: Recent Labs  Lab 06/20/19 0340  GLUCAP 101*    Microbiology: Results for orders placed or performed during the hospital encounter of 06/19/19  SARS Coronavirus 2 by RT PCR (hospital order, performed in [facility] hospital lab) Nasopharyngeal Nasopharyngeal Swab     Status: None   Collection Time: 06/19/19  9:25 AM   Specimen: Nasopharyngeal Swab  Result Value Ref Range Status   SARS Coronavirus 2 NEGATIVE NEGATIVE Final    Comment: (NOTE) SARS-CoV-2 target nucleic acids are NOT DETECTED.  The SARS-CoV-2 RNA is generally detectable in upper and lower respiratory specimens during the acute phase of infection. The lowest concentration of SARS-CoV-2 viral copies this assay can detect is 250 copies / mL. A negative result does not preclude SARS-CoV-2 infection and should not be used as the sole basis for treatment or other patient management decisions.  A negative result may occur with improper specimen collection /  handling, submission of specimen other than nasopharyngeal swab, presence of viral mutation(s) within the areas targeted by this assay, and inadequate number of viral copies (<250 copies / mL). A negative result must be combined with clinical observations, patient history, and epidemiological information.  Fact Sheet for Patients:   BoilerBrush.com.cy  Fact Sheet for Healthcare Providers: https://pope.com/  This test is not yet approved or  cleared by the [place] FDA and has been authorized for detection and/or diagnosis  of SARS-CoV-2 by FDA under an Emergency Use Authorization (EUA).  This EUA will remain in effect (meaning this test can be used) for the duration of the COVID-19 declaration under Section 564(b)(1) of the Act, 21 U.S.C. section 360bbb-3(b)(1), unless the authorization is terminated or revoked sooner.  Performed at [facility], [address] [place], [place] [address]     Coagulation Studies: No results for input(s): LABPROT, INR in the last 72 hours.  Imaging: EEG  Result Date: 06/19/2019 Charlsie Quest, MD     06/19/2019  5:48 PM Patient Name: Mary Wong MRN: [id number] Epilepsy Attending: Charlsie Quest Referring Physician/Provider: Dr Lindajo Royal Date: 06/19/2019 Duration: 21.19 mins Patient history: 29 y.o. female withno significant past medical history who presented to the emergency room following a syncopal episode vs seizure event. Level of alertness: Awake, asleep, comatose, lethargic AEDs during EEG study: Technical aspects: This EEG study was done with scalp electrodes positioned according to the 10-20 International system of electrode placement. Electrical activity was acquired at a sampling rate of 500Hz  and reviewed with a high frequency filter of 70Hz  and a low frequency filter of 1Hz . EEG data were recorded continuously and digitally stored. Description: The posterior dominant rhythm consists of 9.5 Hz activity of moderate voltage (25-35 uV) seen predominantly in posterior head regions, symmetric and reactive to eye opening and eye closing. Sleep was characterized by vertex waves, sleep spindles (12 to 14 Hz), maximal frontocentral region. EEG showed generalized, maximal bifrontal polyspike and wave at 4hz , more frequently during sleep.  Physiology photic driving was not seen during photic stimulation.  Hyperventilation was not performed.   ABNORMALITY -Polyspike and wave, generalized IMPRESSION: This study showed evidence of generalized epilepsy, most like  Juvenile Myoclonic epilepsy but can also be secondary to focal epilepsy with frontal onset. No seizures were seen throughout the recording. Dr was notified.   DG Chest 2 View  Result Date: 06/18/2019 CLINICAL DATA:  Syncope. EXAM: CHEST - 2 VIEW COMPARISON:  Radiograph 04/15/2018 FINDINGS: The cardiomediastinal contours are normal. The lungs are clear. Pulmonary vasculature is normal. No consolidation, pleural effusion, or pneumothorax. No acute osseous abnormalities are seen. IMPRESSION: Negative radiographs of the chest. Electronically Signed   By: Loretha Brasil M.D.   On: 06/18/2019 22:19   CT HEAD WO CONTRAST  Result Date: 06/18/2019 CLINICAL DATA:  Seizure, nontraumatic (Age 21-40y) EXAM: CT HEAD WITHOUT CONTRAST TECHNIQUE: Contiguous axial images were obtained from the base of the skull through the vertex without intravenous contrast. COMPARISON:  Head CT 05/02/2017 FINDINGS: Brain: No intracranial hemorrhage, mass effect, or midline shift. No hydrocephalus. The basilar cisterns are patent. No evidence of territorial infarct or acute ischemia. No extra-axial or intracranial fluid collection. Vascular: No hyperdense vessel or unexpected calcification. Skull: No fracture or focal lesion. Sinuses/Orbits: Paranasal sinuses and mastoid air cells are clear. The visualized orbits are unremarkable. Other: None. IMPRESSION: Negative noncontrast head CT. No acute finding or explanation for seizure. Electronically Signed   By:  Narda Rutherford M.D.   On: 06/18/2019 22:15   CT Angio Chest PE W and/or Wo Contrast  Result Date: 06/19/2019 CLINICAL DATA:  Chest pain, altered mental status EXAM: CT ANGIOGRAPHY CHEST WITH CONTRAST TECHNIQUE: Multidetector CT imaging of the chest was performed using the standard protocol during bolus administration of intravenous contrast. Multiplanar CT image reconstructions and MIPs were obtained to evaluate the vascular anatomy. CONTRAST:  75mL  OMNIPAQUE IOHEXOL 350 MG/ML SOLN COMPARISON:  None. FINDINGS: Cardiovascular: No filling defects in the pulmonary arteries to suggest pulmonary emboli. Heart is normal size. Aorta is normal caliber. Mediastinum/Nodes: No mediastinal, hilar, or axillary adenopathy. Trachea and esophagus are unremarkable. Thyroid unremarkable. Lungs/Pleura: Lungs are clear. No focal airspace opacities or suspicious nodules. No effusions. Upper Abdomen: Imaging into the upper abdomen shows no acute findings. Musculoskeletal: Chest wall soft tissues are unremarkable. No acute bony abnormality. Review of the MIP images confirms the above findings. IMPRESSION: No acute cardiopulmonary disease. Electronically Signed   By: Charlett Nose M.D.   On: 06/19/2019 03:12   MR BRAIN WO CONTRAST  Result Date: 06/19/2019 CLINICAL DATA:  Seizure. EXAM: MRI HEAD WITHOUT CONTRAST TECHNIQUE: Multiplanar, multiecho pulse sequences of the brain and surrounding structures were obtained without intravenous contrast. COMPARISON:  Head CT 06/18/2019 FINDINGS: The study is mildly motion degraded despite repeated imaging attempts. Brain: Dedicated thin section imaging through the temporal lobes demonstrates normal volume and signal of the hippocampi. There is no evidence of heterotopia or cortical dysplasia. There is no evidence of acute infarct, intracranial hemorrhage, mass, midline shift, or extra-axial fluid collection. The ventricles and sulci are normal. The brain is normal in signal. Vascular: Major intracranial vascular flow voids are preserved. Skull and upper cervical spine: Unremarkable bone marrow signal. Sinuses/Orbits: Unremarkable orbits. Paranasal sinuses and mastoid air cells are clear. Other: None. IMPRESSION: Negative brain MRI. Electronically Signed   By: Sebastian Ache M.D.   On: 06/19/2019 13:33   ECHOCARDIOGRAM COMPLETE  Result Date: 06/19/2019    ECHOCARDIOGRAM REPORT   Patient Name:   Mary Wong Date of Exam: 06/19/2019  Medical Rec #:  [id number]         Height:       69.0 in Accession #:    [id number]        Weight:       163.0 lb Date of Birth:  1990/05/25         BSA:          1.894 m Patient Age:    29 years          BP:           Not listed in chart/Not listed                                                 in chart mmHg Patient Gender: F                 HR:           55 bpm. Exam Location:  [facility] Procedure: 2D Echo, Cardiac Doppler, Color Doppler and Intracardiac            Opacification Agent Indications:     Syncope 780.2  History:         Patient has no prior history of Echocardiogram examinations. No  cardiac history listed in chart.  Sonographer:     Cristela BlueJerry Hege RDCS (AE) Referring Phys:  [id number] Andris BaumannHAZEL V DUNCAN Diagnosing Phys: Yvonne Kendallhristopher End MD IMPRESSIONS  1. Left ventricular ejection fraction, by estimation, is 55 to 60%. The left ventricle has normal function. The left ventricle has no regional wall motion abnormalities. Left ventricular diastolic parameters were normal.  2. Right ventricular systolic function is normal. The right ventricular size is normal. There is normal pulmonary artery systolic pressure.  3. The mitral valve is normal in structure. No evidence of mitral valve regurgitation. No evidence of mitral stenosis.  4. The aortic valve has an indeterminant number of cusps. Aortic valve regurgitation is not visualized. No aortic stenosis is present.  5. The inferior vena cava is normal in size with greater than 50% respiratory variability, suggesting right atrial pressure of 3 mmHg. FINDINGS  Left Ventricle: Left ventricular ejection fraction, by estimation, is 55 to 60%. The left ventricle has normal function. The left ventricle has no regional wall motion abnormalities. Definity contrast agent was given IV to delineate the left ventricular  endocardial borders. The left ventricular internal cavity size was normal in size. There is no left ventricular hypertrophy. Left ventricular diastolic  parameters were normal. Right Ventricle: The right ventricular size is normal. No increase in right ventricular wall thickness. Right ventricular systolic function is normal. There is normal pulmonary artery systolic pressure. The tricuspid regurgitant velocity is 1.64 m/s, and  with an assumed right atrial pressure of 3 mmHg, the estimated right ventricular systolic pressure is 13.8 mmHg. Left Atrium: Left atrial size was normal in size. Right Atrium: Right atrial size was normal in size. Pericardium: There is no evidence of pericardial effusion. Mitral Valve: The mitral valve is normal in structure. No evidence of mitral valve regurgitation. No evidence of mitral valve stenosis. Tricuspid Valve: The tricuspid valve is normal in structure. Tricuspid valve regurgitation is not demonstrated. Aortic Valve: The aortic valve has an indeterminant number of cusps. Aortic valve regurgitation is not visualized. No aortic stenosis is present. Aortic valve mean gradient measures 4.0 mmHg. Aortic valve peak gradient measures 6.4 mmHg. Aortic valve area, by VTI measures 2.04 cm. Pulmonic Valve: The pulmonic valve was not well visualized. Pulmonic valve regurgitation is not visualized. No evidence of pulmonic stenosis. Aorta: The aortic root is normal in size and structure. Pulmonary Artery: The pulmonary artery is not well seen. Venous: The inferior vena cava is normal in size with greater than 50% respiratory variability, suggesting right atrial pressure of 3 mmHg. IAS/Shunts: The interatrial septum was not well visualized.  LEFT VENTRICLE PLAX 2D LVIDd:         4.64 cm  Diastology LVIDs:         3.17 cm  LV e' lateral:   17.20 cm/s LV PW:         0.86 cm  LV E/e' lateral: 5.5 LV IVS:        0.68 cm  LV e' medial:    12.80 cm/s LVOT diam:     2.00 cm  LV E/e' medial:  7.4 LV SV:         58 LV SV Index:   31 LVOT Area:     3.14 cm  RIGHT VENTRICLE RV Basal diam:  3.14 cm RV S prime:     14.50 cm/s TAPSE (M-mode): 2.6 cm LEFT  ATRIUM             Index  RIGHT ATRIUM           Index LA diam:        2.50 cm 1.32 cm/m  RA Area:     14.70 cm LA Vol (A2C):   56.9 ml 30.04 ml/m RA Volume:   38.20 ml  20.17 ml/m LA Vol (A4C):   42.4 ml 22.39 ml/m LA Biplane Vol: 52.0 ml 27.46 ml/m  AORTIC VALVE                   PULMONIC VALVE AV Area (Vmax):    2.52 cm    PV Vmax:        0.67 m/s AV Area (Vmean):   2.29 cm    PV Peak grad:   1.8 mmHg AV Area (VTI):     2.04 cm    RVOT Peak grad: 2 mmHg AV Vmax:           126.00 cm/s AV Vmean:          92.000 cm/s AV VTI:            0.284 m AV Peak Grad:      6.4 mmHg AV Mean Grad:      4.0 mmHg LVOT Vmax:         101.00 cm/s LVOT Vmean:        67.000 cm/s LVOT VTI:          0.184 m LVOT/AV VTI ratio: 0.65  AORTA Ao Root diam: 2.60 cm MITRAL VALVE               TRICUSPID VALVE MV Area (PHT): 3.24 cm    TR Peak grad:   10.8 mmHg MV Decel Time: 234 msec    TR Vmax:        164.00 cm/s MV E velocity: 94.70 cm/s MV A velocity: 57.80 cm/s  SHUNTS MV E/A ratio:  1.64        Systemic VTI:  0.18 m                            Systemic Diam: 2.00 cm Yvonne Kendall MD Electronically signed by Yvonne Kendall MD Signature Date/Time: 06/19/2019/3:31:32 PM    Final    CT Maxillofacial Wo Contrast  Result Date: 06/19/2019 CLINICAL DATA:  Possible seizure EXAM: CT MAXILLOFACIAL WITHOUT CONTRAST TECHNIQUE: Multidetector CT imaging of the maxillofacial structures was performed. Multiplanar CT image reconstructions were also generated. COMPARISON:  None. FINDINGS: Osseous: No fracture or mandibular dislocation. No destructive process. Orbits: Negative. No traumatic or inflammatory finding. Sinuses: Slight mucosal thickening in the left maxillary sinus. No air-fluid levels. Soft tissues: Soft tissue swelling over the left orbit Limited intracranial: No acute findings IMPRESSION: No facial or orbital fracture. Soft tissue swelling over the left orbit. Electronically Signed   By: Charlett Nose M.D.   On: 06/19/2019  03:14    Medications:  I have reviewed the patient's current medications. Scheduled: . enoxaparin (LOVENOX) injection  40 mg Subcutaneous Q24H  . levETIRAcetam  500 mg Oral BID  . sodium chloride flush  3 mL Intravenous Q12H    Assessment/Plan: 29 y.o. female withno significant past medical history who presented to the emergency room following a syncopal episode vs seizure event. No history of seizures prior. Has been stable with no recurrent events since admission.  MRI of the brain personally reviewed and unremarkable.  EEG consistent with JME.  With EEG findings anticonvulsant therapy is indicated.  This was initially discussed with patient who refused anticonvulsant therapy.  It seems that with further thought she has reconsidered.    Recommendations: 1. Keppra 500mg  BID 2. Seizure precautions 3. Patient to follow up with neurology after discharge as an outpatient 4. Patient unable to drive, operate heavy machinery, perform activities at heights and participate in water activities until release by outpatient physician.    LOS: 0 days   , MD Neurology [phone] 06/20/2019  10:46 AM

## 2019-06-20 NOTE — Progress Notes (Signed)
Patient ID: Mary Wong, female   DOB: Mar 03, 1990, 29 y.o.   MRN: [id number] After discussing at length with pt she is now agreeable to take Keppra 500 mg bid She will be observed one more day to see how she tolerates

## 2019-06-20 NOTE — Progress Notes (Signed)
   06/20/19 0808  Assess: MEWS Score  Temp 97.8 F (36.6 C)  BP (!) 79/38  Pulse Rate (!) 46  Resp 16  SpO2 99 %  Assess: MEWS Score  MEWS Temp 0  MEWS Systolic 2  MEWS Pulse 1  MEWS RR 0  MEWS LOC 0  MEWS Score 3  MEWS Score Color Yellow  Assess: if the MEWS score is Yellow or Red  Were vital signs taken at a resting state? Yes  Focused Assessment Documented focused assessment  Early Detection of Sepsis Score *See Row Information* Low  MEWS guidelines implemented *See Row Information* Yes  Treat  MEWS Interventions Escalated (See documentation below)  Take Vital Signs  Increase Vital Sign Frequency  Yellow: Q 2hr X 2 then Q 4hr X 2, if remains yellow, continue Q 4hrs  Escalate  MEWS: Escalate Yellow: discuss with charge nurse/RN and consider discussing with provider and RRT  Notify: Charge Nurse/RN  Name of Charge Nurse/RN Notified Alvan Dame RN  Date Charge Nurse/RN Notified 06/20/19  Time Charge Nurse/RN Notified 0810  Document  Patient Outcome Stabilized after interventions

## 2019-06-20 NOTE — Progress Notes (Signed)
[facility]  - [place] [facility]   PATIENT NAME: Mary Wong    MR#:  [id number]  DATE OF BIRTH:  1990/03/10  SUBJECTIVE:  patient complains of headache and pain on the left side of her jaw. She has a black eye after the seizure episode and fall at home. No seizures reported. Blood pressure initially reported in the upper 70s. During my evaluation it was 87/50.  No seizures reported by staff. Patient complaining of headache on and off. She has history of migraine headaches. REVIEW OF SYSTEMS:   Review of Systems  Constitutional: Positive for malaise/fatigue. Negative for chills, fever and weight loss.  HENT: Negative for ear discharge, ear pain and nosebleeds.   Eyes: Positive for pain. Negative for blurred vision and discharge.  Respiratory: Negative for sputum production, shortness of breath, wheezing and stridor.   Cardiovascular: Negative for chest pain, palpitations, orthopnea and PND.  Gastrointestinal: Negative for abdominal pain, diarrhea, nausea and vomiting.  Genitourinary: Negative for frequency and urgency.  Musculoskeletal: Negative for back pain and joint pain.  Neurological: Positive for weakness and headaches. Negative for sensory change, speech change and focal weakness.  Psychiatric/Behavioral: Negative for depression and hallucinations. The patient is not nervous/anxious.    Tolerating Diet: Tolerating PT:   DRUG ALLERGIES:  No Known Allergies  VITALS:  Blood pressure (!) 79/38, pulse (!) 46, temperature 97.8 F (36.6 C), temperature source Oral, resp. rate 16, height 5\' 9"  (1.753 m), weight 73.9 kg, last menstrual period 05/23/2019, SpO2 99 %, unknown if currently breastfeeding.  PHYSICAL EXAMINATION:   Physical Exam  GENERAL:  29 y.o.-year-old patient lying in the bed with no acute distress.  EYES: Pupils equal, round, reactive to light and accommodation. No scleral icterus.   HEENT: Head atraumatic, normocephalic. Oropharynx  and nasopharynx clear. Left black eye + with bruise NECK:  Supple, no jugular venous distention. No thyroid enlargement, no tenderness.  LUNGS: Normal breath sounds bilaterally, no wheezing, rales, rhonchi. No use of accessory muscles of respiration.  CARDIOVASCULAR: S1, S2 normal. No murmurs, rubs, or gallops.  ABDOMEN: Soft, nontender, nondistended. Bowel sounds present. No organomegaly or mass.  EXTREMITIES: No cyanosis, clubbing or edema b/l.    NEUROLOGIC: Cranial nerves II through XII are intact. No focal Motor or sensory deficits b/l.   PSYCHIATRIC:  patient is alert and oriented x 3.  SKIN: No obvious rash, lesion, or ulcer.   LABORATORY PANEL:  CBC Recent Labs  Lab 06/18/19 2156  WBC 9.2  HGB 12.7  HCT 38.5  PLT 217    Chemistries  Recent Labs  Lab 06/17/19 1631 06/17/19 1631 06/18/19 2156  NA 138   < > 138  K 3.7   < > 3.7  CL 107   < > 105  CO2 24   < > 27  GLUCOSE 86   < > 101*  BUN 9   < > 11  CREATININE 0.51   < > 0.61  CALCIUM 8.8*   < > 8.9  AST 12*  --   --   ALT 10  --   --   ALKPHOS 54  --   --   BILITOT 0.6  --   --    < > = values in this interval not displayed.   Cardiac Enzymes No results for input(s): TROPONINI in the last 168 hours. RADIOLOGY:  EEG  Result Date: 06/19/2019 06/21/2019, MD     06/19/2019  5:48 PM Patient Name: Mary Wong  Sidney MRN: [id number] Epilepsy Attending: Charlsie Quest Referring Physician/Provider: Dr Lindajo Royal Date: 06/19/2019 Duration: 21.19 mins Patient history: 29 y.o. female withno significant past medical history who presented to the emergency room following a syncopal episode vs seizure event. Level of alertness: Awake, asleep, comatose, lethargic AEDs during EEG study: Technical aspects: This EEG study was done with scalp electrodes positioned according to the 10-20 International system of electrode placement. Electrical activity was acquired at a sampling rate of 500Hz  and reviewed with a high frequency  filter of 70Hz  and a low frequency filter of 1Hz . EEG data were recorded continuously and digitally stored. Description: The posterior dominant rhythm consists of 9.5 Hz activity of moderate voltage (25-35 uV) seen predominantly in posterior head regions, symmetric and reactive to eye opening and eye closing. Sleep was characterized by vertex waves, sleep spindles (12 to 14 Hz), maximal frontocentral region. EEG showed generalized, maximal bifrontal polyspike and wave at 4hz , more frequently during sleep.  Physiology photic driving was not seen during photic stimulation.  Hyperventilation was not performed.   ABNORMALITY -Polyspike and wave, generalized IMPRESSION: This study showed evidence of generalized epilepsy, most like Juvenile Myoclonic epilepsy but can also be secondary to focal epilepsy with frontal onset. No seizures were seen throughout the recording. Dr was notified.   DG Chest 2 View  Result Date: 06/18/2019 CLINICAL DATA:  Syncope. EXAM: CHEST - 2 VIEW COMPARISON:  Radiograph 04/15/2018 FINDINGS: The cardiomediastinal contours are normal. The lungs are clear. Pulmonary vasculature is normal. No consolidation, pleural effusion, or pneumothorax. No acute osseous abnormalities are seen. IMPRESSION: Negative radiographs of the chest. Electronically Signed   By: Loretha Brasil M.D.   On: 06/18/2019 22:19   CT HEAD WO CONTRAST  Result Date: 06/18/2019 CLINICAL DATA:  Seizure, nontraumatic (Age 61-40y) EXAM: CT HEAD WITHOUT CONTRAST TECHNIQUE: Contiguous axial images were obtained from the base of the skull through the vertex without intravenous contrast. COMPARISON:  Head CT 05/02/2017 FINDINGS: Brain: No intracranial hemorrhage, mass effect, or midline shift. No hydrocephalus. The basilar cisterns are patent. No evidence of territorial infarct or acute ischemia. No extra-axial or intracranial fluid collection. Vascular: No hyperdense vessel or unexpected calcification.  Skull: No fracture or focal lesion. Sinuses/Orbits: Paranasal sinuses and mastoid air cells are clear. The visualized orbits are unremarkable. Other: None. IMPRESSION: Negative noncontrast head CT. No acute finding or explanation for seizure. Electronically Signed   By: 06/20/2019 M.D.   On: 06/18/2019 22:15   CT Angio Chest PE W and/or Wo Contrast  Result Date: 06/19/2019 CLINICAL DATA:  Chest pain, altered mental status EXAM: CT ANGIOGRAPHY CHEST WITH CONTRAST TECHNIQUE: Multidetector CT imaging of the chest was performed using the standard protocol during bolus administration of intravenous contrast. Multiplanar CT image reconstructions and MIPs were obtained to evaluate the vascular anatomy. CONTRAST:  54mL OMNIPAQUE IOHEXOL 350 MG/ML SOLN COMPARISON:  None. FINDINGS: Cardiovascular: No filling defects in the pulmonary arteries to suggest pulmonary emboli. Heart is normal size. Aorta is normal caliber. Mediastinum/Nodes: No mediastinal, hilar, or axillary adenopathy. Trachea and esophagus are unremarkable. Thyroid unremarkable. Lungs/Pleura: Lungs are clear. No focal airspace opacities or suspicious nodules. No effusions. Upper Abdomen: Imaging into the upper abdomen shows no acute findings. Musculoskeletal: Chest wall soft tissues are unremarkable. No acute bony abnormality. Review of the MIP images confirms the above findings. IMPRESSION: No acute cardiopulmonary disease. Electronically Signed   By: 06/20/2019 M.D.   On: 06/19/2019 03:12   MR BRAIN  WO CONTRAST  Result Date: 06/19/2019 CLINICAL DATA:  Seizure. EXAM: MRI HEAD WITHOUT CONTRAST TECHNIQUE: Multiplanar, multiecho pulse sequences of the brain and surrounding structures were obtained without intravenous contrast. COMPARISON:  Head CT 06/18/2019 FINDINGS: The study is mildly motion degraded despite repeated imaging attempts. Brain: Dedicated thin section imaging through the temporal lobes demonstrates normal volume and signal of the  hippocampi. There is no evidence of heterotopia or cortical dysplasia. There is no evidence of acute infarct, intracranial hemorrhage, mass, midline shift, or extra-axial fluid collection. The ventricles and sulci are normal. The brain is normal in signal. Vascular: Major intracranial vascular flow voids are preserved. Skull and upper cervical spine: Unremarkable bone marrow signal. Sinuses/Orbits: Unremarkable orbits. Paranasal sinuses and mastoid air cells are clear. Other: None. IMPRESSION: Negative brain MRI. Electronically Signed   By: Sebastian Ache M.D.   On: 06/19/2019 13:33   ECHOCARDIOGRAM COMPLETE  Result Date: 06/19/2019    ECHOCARDIOGRAM REPORT   Patient Name:   Mary Wong Date of Exam: 06/19/2019 Medical Rec #:  [id number]         Height:       69.0 in Accession #:    [id number]        Weight:       163.0 lb Date of Birth:  05-20-1990         BSA:          1.894 m Patient Age:    29 years          BP:           Not listed in chart/Not listed                                                 in chart mmHg Patient Gender: F                 HR:           55 bpm. Exam Location:  [facility] Procedure: 2D Echo, Cardiac Doppler, Color Doppler and Intracardiac            Opacification Agent Indications:     Syncope 780.2  History:         Patient has no prior history of Echocardiogram examinations. No                  cardiac history listed in chart.  Sonographer:     Cristela Blue RDCS (AE) Referring Phys:  [id number] Andris Baumann Diagnosing Phys: Yvonne Kendall MD IMPRESSIONS  1. Left ventricular ejection fraction, by estimation, is 55 to 60%. The left ventricle has normal function. The left ventricle has no regional wall motion abnormalities. Left ventricular diastolic parameters were normal.  2. Right ventricular systolic function is normal. The right ventricular size is normal. There is normal pulmonary artery systolic pressure.  3. The mitral valve is normal in structure. No evidence of mitral valve  regurgitation. No evidence of mitral stenosis.  4. The aortic valve has an indeterminant number of cusps. Aortic valve regurgitation is not visualized. No aortic stenosis is present.  5. The inferior vena cava is normal in size with greater than 50% respiratory variability, suggesting right atrial pressure of 3 mmHg. FINDINGS  Left Ventricle: Left ventricular ejection fraction, by estimation, is 55 to 60%. The left ventricle has normal function. The left ventricle  has no regional wall motion abnormalities. Definity contrast agent was given IV to delineate the left ventricular  endocardial borders. The left ventricular internal cavity size was normal in size. There is no left ventricular hypertrophy. Left ventricular diastolic parameters were normal. Right Ventricle: The right ventricular size is normal. No increase in right ventricular wall thickness. Right ventricular systolic function is normal. There is normal pulmonary artery systolic pressure. The tricuspid regurgitant velocity is 1.64 m/s, and  with an assumed right atrial pressure of 3 mmHg, the estimated right ventricular systolic pressure is 62.2 mmHg. Left Atrium: Left atrial size was normal in size. Right Atrium: Right atrial size was normal in size. Pericardium: There is no evidence of pericardial effusion. Mitral Valve: The mitral valve is normal in structure. No evidence of mitral valve regurgitation. No evidence of mitral valve stenosis. Tricuspid Valve: The tricuspid valve is normal in structure. Tricuspid valve regurgitation is not demonstrated. Aortic Valve: The aortic valve has an indeterminant number of cusps. Aortic valve regurgitation is not visualized. No aortic stenosis is present. Aortic valve mean gradient measures 4.0 mmHg. Aortic valve peak gradient measures 6.4 mmHg. Aortic valve area, by VTI measures 2.04 cm. Pulmonic Valve: The pulmonic valve was not well visualized. Pulmonic valve regurgitation is not visualized. No evidence of  pulmonic stenosis. Aorta: The aortic root is normal in size and structure. Pulmonary Artery: The pulmonary artery is not well seen. Venous: The inferior vena cava is normal in size with greater than 50% respiratory variability, suggesting right atrial pressure of 3 mmHg. IAS/Shunts: The interatrial septum was not well visualized.  LEFT VENTRICLE PLAX 2D LVIDd:         4.64 cm  Diastology LVIDs:         3.17 cm  LV e' lateral:   17.20 cm/s LV PW:         0.86 cm  LV E/e' lateral: 5.5 LV IVS:        0.68 cm  LV e' medial:    12.80 cm/s LVOT diam:     2.00 cm  LV E/e' medial:  7.4 LV SV:         58 LV SV Index:   31 LVOT Area:     3.14 cm  RIGHT VENTRICLE RV Basal diam:  3.14 cm RV S prime:     14.50 cm/s TAPSE (M-mode): 2.6 cm LEFT ATRIUM             Index       RIGHT ATRIUM           Index LA diam:        2.50 cm 1.32 cm/m  RA Area:     14.70 cm LA Vol (A2C):   56.9 ml 30.04 ml/m RA Volume:   38.20 ml  20.17 ml/m LA Vol (A4C):   42.4 ml 22.39 ml/m LA Biplane Vol: 52.0 ml 27.46 ml/m  AORTIC VALVE                   PULMONIC VALVE AV Area (Vmax):    2.52 cm    PV Vmax:        0.67 m/s AV Area (Vmean):   2.29 cm    PV Peak grad:   1.8 mmHg AV Area (VTI):     2.04 cm    RVOT Peak grad: 2 mmHg AV Vmax:           126.00 cm/s AV Vmean:  92.000 cm/s AV VTI:            0.284 m AV Peak Grad:      6.4 mmHg AV Mean Grad:      4.0 mmHg LVOT Vmax:         101.00 cm/s LVOT Vmean:        67.000 cm/s LVOT VTI:          0.184 m LVOT/AV VTI ratio: 0.65  AORTA Ao Root diam: 2.60 cm MITRAL VALVE               TRICUSPID VALVE MV Area (PHT): 3.24 cm    TR Peak grad:   10.8 mmHg MV Decel Time: 234 msec    TR Vmax:        164.00 cm/s MV E velocity: 94.70 cm/s MV A velocity: 57.80 cm/s  SHUNTS MV E/A ratio:  1.64        Systemic VTI:  0.18 m                            Systemic Diam: 2.00 cm Yvonne Kendallhristopher End MD Electronically signed by Yvonne Kendallhristopher End MD Signature Date/Time: 06/19/2019/3:31:32 PM    Final    CT Maxillofacial  Wo Contrast  Result Date: 06/19/2019 CLINICAL DATA:  Possible seizure EXAM: CT MAXILLOFACIAL WITHOUT CONTRAST TECHNIQUE: Multidetector CT imaging of the maxillofacial structures was performed. Multiplanar CT image reconstructions were also generated. COMPARISON:  None. FINDINGS: Osseous: No fracture or mandibular dislocation. No destructive process. Orbits: Negative. No traumatic or inflammatory finding. Sinuses: Slight mucosal thickening in the left maxillary sinus. No air-fluid levels. Soft tissues: Soft tissue swelling over the left orbit Limited intracranial: No acute findings IMPRESSION: No facial or orbital fracture. Soft tissue swelling over the left orbit. Electronically Signed   By: Charlett NoseKevin  Dover M.D.   On: 06/19/2019 03:14   ASSESSMENT AND PLAN:   Mary Wong is a 29 y.o. female with no significant past medical history who presented to the emergency room following a syncopal episode.  Patient had been seen in the emergency room the day prior for upper abdominal pain and discharged with a diagnosis of GERD.  [facility] and they discharged home with a similar diagnosis placing her on omeprazole.  Not long after taking one of the omeprazole tablets while playing with her son, according to her husband she screamed out, suddenly collapsed and started shaking and foaming through the mouth.   Syncope and collapse due to New onset Seizure Chronic headaches -Patient presents with a syncopal episode, with shaking and frothing at the mouth.  Previously healthy -Echo --wnl -EEG POSITIVE-->showed evidence of generalized epilepsy, most like Juvenile Myoclonic epilepsy but can also be secondary to focal epilepsy with frontal onset -Neurology consult noted. Await Dr. Thad Rangereynolds further opinion. -Ativan as needed seizure -Seizure precautions --pt has seen dr Malvin JohnsPotter in 2019 for headaches--was prescribed Rizatriptan--no f/u noted afterwards    Periorbital contusion of left  eye -Pain control.  Cool compresses   Relative hypootension -pt 's BP is staying in the 90-110. Today 87/50   DVT prophylaxis: Lovenox  Code Status: full code  Family Communication:  none  Disposition Plan: Back to previous home environment Consults called: Neurology    Status is: inpatient  Patient's EEG is positive for seizures. Neurology input awaited. Likely will be started on antiseizure meds. Needs to be monitored for another day or two.  Dispo:  The patient is from: Home              Anticipated d/c is to: Home              Anticipated d/c date is: 1 day              Patient currently is not medically stable to d/c.        TOTAL TIME TAKING CARE OF THIS PATIENT: 30 minutes.  >50% time spent on counselling and coordination of care  Note: This dictation was prepared with Dragon dictation along with smaller phrase technology. Any transcriptional errors that result from this process are unintentional.  Enedina Finner M.D    [facility]   CC: Primary care physician; [facility] HealthPatient ID: SHREENA BAINES, female   DOB: 04-Jul-1990, 29 y.o.   MRN: [id number]

## 2019-06-21 DIAGNOSIS — G40B09 Juvenile myoclonic epilepsy, not intractable, without status epilepticus: Principal | ICD-10-CM

## 2019-06-21 MED ORDER — LEVETIRACETAM 500 MG PO TABS: 500.0000 mg | ORAL_TABLET | Freq: Two times a day (BID) | ORAL | 1 refills | Status: DC

## 2019-06-21 NOTE — Discharge Summary (Signed)
[facility] [place] at [facility]   PATIENT NAME: Mary Wong    MR#:  [id number]  DATE OF BIRTH:  26-Jan-1990  DATE OF ADMISSION:  06/19/2019 ADMITTING PHYSICIAN: Enedina Finner, MD  DATE OF DISCHARGE: 06/21/2019  PRIMARY CARE PHYSICIAN: Center, [facility]    ADMISSION DIAGNOSIS:  Syncope and collapse [R55] Seizure (HCC) [R56.9] Blunt trauma of face, initial encounter [S09.93XA]  DISCHARGE DIAGNOSIS:  Juvenile Myoclonic Epilepsy/New onset Seizures H/o Chronic Headaches SECONDARY DIAGNOSIS:   Past Medical History:  Diagnosis Date  . Medical history non-contributory   . Ovarian cyst   . Scoliosis     HOSPITAL COURSE:   Mary F Smithis a 29 y.o.femalewithno significant past medical history who presented to the emergency room following a syncopal episode. Patient had been seen in the emergency room the day prior for upper abdominal pain and discharged with a diagnosis of GERD. She went to [facility] for a second opinion and they discharged home with a similar diagnosis placing her on omeprazole. Not long after taking one of the omeprazole tablets while playing with her son, according to her husband she screamed out, suddenly collapsed and started shaking and foaming through the mouth.   Syncope and collapse due to New onset Seizure/Juvenile Myoclonic Epilepsy Chronic headaches -Patient presents with a syncopal episode, with shaking and frothing at the mouth. Previously healthy -Echo --wnl --MRI brain unremarkable -EEG POSITIVE-->showed evidence of Juvenile myoclonic epilepsy, most like Juvenile Myoclonic epilepsybut can also be secondary to focal epilepsy with frontal onset -Neurology consult noted. Dr. Thad Ranger input noted --started on po keppra --advised not to drive, operate heavy machinery, water sports or activties from heights -pt will f/u out pt with Dr Malvin Johns -Ativan as needed seizure -Seizure precautions --pt has seen dr Malvin Johns in  2019 for headaches  Periorbital contusion of left eye -Pain control. Cool compresses  Relative hypootension -pt 's BP is staying in the 90-110. Today 87/50 -asymptomatic  Pt eager to go home. No seizures   DVT prophylaxis:Lovenox  Code Status:full code Family Communication:none Disposition Plan:Back to previous home environment Consults called:Neurology    Status is: inpatient  Dispo: The patient is from: Home  Anticipated d/c is to: Home  Anticipated d/c date is: today  Patient currently is medically stable to d/c.  CONSULTS OBTAINED:  Treatment Team:  Pauletta Browns, MD Thana Farr, MD  DRUG ALLERGIES:  No Known Allergies  DISCHARGE MEDICATIONS:   Allergies as of 06/21/2019   No Known Allergies     Medication List    STOP taking these medications   butalbital-acetaminophen-caffeine 50-325-40 MG tablet Commonly known as: FIORICET   ZOLOFT PO     TAKE these medications   levETIRAcetam 500 MG tablet Commonly known as: KEPPRA Take 1 tablet (500 mg total) by mouth 2 (two) times daily.   omeprazole 20 MG tablet Commonly known as: PriLOSEC OTC Take 1 tablet (20 mg total) by mouth daily.   zolpidem 10 MG tablet Commonly known as: AMBIEN Take 10 mg by mouth at bedtime.       If you experience worsening of your admission symptoms, develop shortness of breath, life threatening emergency, suicidal or homicidal thoughts you must seek medical attention immediately by calling 911 or calling your MD immediately  if symptoms less severe.  You Must read complete instructions/literature along with all the possible adverse reactions/side effects for all the Medicines you take and that have been prescribed to you. Take any new Medicines after you have completely  understood and accept all the possible adverse reactions/side effects.   Please note  You were cared for by a hospitalist during your hospital  stay. If you have any questions about your discharge medications or the care you received while you were in the hospital after you are discharged, you can call the unit and asked to speak with the hospitalist on call if the hospitalist that took care of you is not available. Once you are discharged, your primary care physician will handle any further medical issues. Please note that NO REFILLS for any discharge medications will be authorized once you are discharged, as it is imperative that you return to your primary care physician (or establish a relationship with a primary care physician if you do not have one) for your aftercare needs so that they can reassess your need for medications and monitor your lab values. Today   SUBJECTIVE   No new complaints  VITAL SIGNS:  Blood pressure (!) 97/45, pulse 69, temperature 98.4 F (36.9 C), temperature source Oral, resp. rate 18, height 5\' 9"  (1.753 m), weight 73.9 kg, last menstrual period 05/23/2019, SpO2 99 %, unknown if currently breastfeeding.  I/O:    Intake/Output Summary (Last 24 hours) at 06/21/2019 0915 Last data filed at 06/20/2019 1300 Gross per 24 hour  Intake 120 ml  Output --  Net 120 ml    PHYSICAL EXAMINATION:  GENERAL:  29 y.o.-year-old patient lying in the bed with no acute distress.  EYES: Pupils equal, round, reactive to light and accommodation. No scleral icterus.  HEENT: Head atraumatic, normocephalic. Oropharynx and nasopharynx clear. Left eye contusion NECK:  Supple, no jugular venous distention. No thyroid enlargement, no tenderness.  LUNGS: Normal breath sounds bilaterally, no wheezing, rales,rhonchi or crepitation. No use of accessory muscles of respiration.  CARDIOVASCULAR: S1, S2 normal. No murmurs, rubs, or gallops.  ABDOMEN: Soft, non-tender, non-distended. Bowel sounds present. No organomegaly or mass.  EXTREMITIES: No pedal edema, cyanosis, or clubbing.  NEUROLOGIC: Cranial nerves II through XII are intact.  Muscle strength 5/5 in all extremities. Sensation intact. Gait not checked.  PSYCHIATRIC: The patient is alert and oriented x 3.  SKIN: No obvious rash, lesion, or ulcer.   DATA REVIEW:   CBC  Recent Labs  Lab 06/18/19 2156  WBC 9.2  HGB 12.7  HCT 38.5  PLT 217    Chemistries  Recent Labs  Lab 06/17/19 1631 06/17/19 1631 06/18/19 2156  NA 138   < > 138  K 3.7   < > 3.7  CL 107   < > 105  CO2 24   < > 27  GLUCOSE 86   < > 101*  BUN 9   < > 11  CREATININE 0.51   < > 0.61  CALCIUM 8.8*   < > 8.9  AST 12*  --   --   ALT 10  --   --   ALKPHOS 54  --   --   BILITOT 0.6  --   --    < > = values in this interval not displayed.    Microbiology Results   Recent Results (from the past 240 hour(s))  SARS Coronavirus 2 by RT PCR (hospital order, performed in [facility] hospital lab) Nasopharyngeal Nasopharyngeal Swab     Status: None   Collection Time: 06/19/19  9:25 AM   Specimen: Nasopharyngeal Swab  Result Value Ref Range Status   SARS Coronavirus 2 NEGATIVE NEGATIVE Final    Comment: (NOTE) SARS-CoV-2 target  nucleic acids are NOT DETECTED.  The SARS-CoV-2 RNA is generally detectable in upper and lower respiratory specimens during the acute phase of infection. The lowest concentration of SARS-CoV-2 viral copies this assay can detect is 250 copies / mL. A negative result does not preclude SARS-CoV-2 infection and should not be used as the sole basis for treatment or other patient management decisions.  A negative result may occur with improper specimen collection / handling, submission of specimen other than nasopharyngeal swab, presence of viral mutation(s) within the areas targeted by this assay, and inadequate number of viral copies (<250 copies / mL). A negative result must be combined with clinical observations, patient history, and epidemiological information.  Fact Sheet for Patients:   BoilerBrush.com.cy  Fact Sheet for Healthcare  Providers: https://pope.com/  This test is not yet approved or  cleared by the [place] FDA and has been authorized for detection and/or diagnosis of SARS-CoV-2 by FDA under an Emergency Use Authorization (EUA).  This EUA will remain in effect (meaning this test can be used) for the duration of the COVID-19 declaration under Section 564(b)(1) of the Act, 21 U.S.C. section 360bbb-3(b)(1), unless the authorization is terminated or revoked sooner.  Performed at [facility], [address] [place] [place], [place] [address]     RADIOLOGY:  EEG  Result Date: 06/19/2019 Charlsie Quest, MD     06/19/2019  5:48 PM Patient Name: NOLIE BIGNELL MRN: [id number] Epilepsy Attending: Charlsie Quest Referring Physician/Provider: Dr Lindajo Royal Date: 06/19/2019 Duration: 21.19 mins Patient history: 29 y.o. female withno significant past medical history who presented to the emergency room following a syncopal episode vs seizure event. Level of alertness: Awake, asleep, comatose, lethargic AEDs during EEG study: Technical aspects: This EEG study was done with scalp electrodes positioned according to the 10-20 International system of electrode placement. Electrical activity was acquired at a sampling rate of 500Hz  and reviewed with a high frequency filter of 70Hz  and a low frequency filter of 1Hz . EEG data were recorded continuously and digitally stored. Description: The posterior dominant rhythm consists of 9.5 Hz activity of moderate voltage (25-35 uV) seen predominantly in posterior head regions, symmetric and reactive to eye opening and eye closing. Sleep was characterized by vertex waves, sleep spindles (12 to 14 Hz), maximal frontocentral region. EEG showed generalized, maximal bifrontal polyspike and wave at 4hz , more frequently during sleep.  Physiology photic driving was not seen during photic stimulation.  Hyperventilation was not performed.   ABNORMALITY -Polyspike  and wave, generalized IMPRESSION: This study showed evidence of generalized epilepsy, most like Juvenile Myoclonic epilepsy but can also be secondary to focal epilepsy with frontal onset. No seizures were seen throughout the recording. Dr was notified.   MR BRAIN WO CONTRAST  Result Date: 06/19/2019 CLINICAL DATA:  Seizure. EXAM: MRI HEAD WITHOUT CONTRAST TECHNIQUE: Multiplanar, multiecho pulse sequences of the brain and surrounding structures were obtained without intravenous contrast. COMPARISON:  Head CT 06/18/2019 FINDINGS: The study is mildly motion degraded despite repeated imaging attempts. Brain: Dedicated thin section imaging through the temporal lobes demonstrates normal volume and signal of the hippocampi. There is no evidence of heterotopia or cortical dysplasia. There is no evidence of acute infarct, intracranial hemorrhage, mass, midline shift, or extra-axial fluid collection. The ventricles and sulci are normal. The brain is normal in signal. Vascular: Major intracranial vascular flow voids are preserved. Skull and upper cervical spine: Unremarkable bone marrow signal. Sinuses/Orbits: Unremarkable orbits. Paranasal sinuses and mastoid  air cells are clear. Other: None. IMPRESSION: Negative brain MRI. Electronically Signed   By: Sebastian Ache M.D.   On: 06/19/2019 13:33   ECHOCARDIOGRAM COMPLETE  Result Date: 06/19/2019    ECHOCARDIOGRAM REPORT   Patient Name:   ALESHKA CORNEY Date of Exam: 06/19/2019 Medical Rec #:  [id number]         Height:       69.0 in Accession #:    [id number]        Weight:       163.0 lb Date of Birth:  10/07/1990         BSA:          1.894 m Patient Age:    29 years          BP:           Not listed in chart/Not listed                                                 in chart mmHg Patient Gender: F                 HR:           55 bpm. Exam Location:  [facility] Procedure: 2D Echo, Cardiac Doppler, Color Doppler and Intracardiac             Opacification Agent Indications:     Syncope 780.2  History:         Patient has no prior history of Echocardiogram examinations. No                  cardiac history listed in chart.  Sonographer:     Cristela Blue RDCS (AE) Referring Phys:  [id number] Andris Baumann Diagnosing Phys: Yvonne [facility] MD IMPRESSIONS  1. Left ventricular ejection fraction, by estimation, is 55 to 60%. The left ventricle has normal function. The left ventricle has no regional wall motion abnormalities. Left ventricular diastolic parameters were normal.  2. Right ventricular systolic function is normal. The right ventricular size is normal. There is normal pulmonary artery systolic pressure.  3. The mitral valve is normal in structure. No evidence of mitral valve regurgitation. No evidence of mitral stenosis.  4. The aortic valve has an indeterminant number of cusps. Aortic valve regurgitation is not visualized. No aortic stenosis is present.  5. The inferior vena cava is normal in size with greater than 50% respiratory variability, suggesting right atrial pressure of 3 mmHg. FINDINGS  Left Ventricle: Left ventricular ejection fraction, by estimation, is 55 to 60%. The left ventricle has normal function. The left ventricle has no regional wall motion abnormalities. Definity contrast agent was given IV to delineate the left ventricular  endocardial borders. The left ventricular internal cavity size was normal in size. There is no left ventricular hypertrophy. Left ventricular diastolic parameters were normal. Right Ventricle: The right ventricular size is normal. No increase in right ventricular wall thickness. Right ventricular systolic function is normal. There is normal pulmonary artery systolic pressure. The tricuspid regurgitant velocity is 1.64 m/s, and  with an assumed right atrial pressure of 3 mmHg, the estimated right ventricular systolic pressure is 13.8 mmHg. Left Atrium: Left atrial size was normal in size. Right Atrium: Right  atrial size was normal in size. Pericardium: There is no evidence of pericardial effusion. Mitral Valve:  The mitral valve is normal in structure. No evidence of mitral valve regurgitation. No evidence of mitral valve stenosis. Tricuspid Valve: The tricuspid valve is normal in structure. Tricuspid valve regurgitation is not demonstrated. Aortic Valve: The aortic valve has an indeterminant number of cusps. Aortic valve regurgitation is not visualized. No aortic stenosis is present. Aortic valve mean gradient measures 4.0 mmHg. Aortic valve peak gradient measures 6.4 mmHg. Aortic valve area, by VTI measures 2.04 cm. Pulmonic Valve: The pulmonic valve was not well visualized. Pulmonic valve regurgitation is not visualized. No evidence of pulmonic stenosis. Aorta: The aortic root is normal in size and structure. Pulmonary Artery: The pulmonary artery is not well seen. Venous: The inferior vena cava is normal in size with greater than 50% respiratory variability, suggesting right atrial pressure of 3 mmHg. IAS/Shunts: The interatrial septum was not well visualized.  LEFT VENTRICLE PLAX 2D LVIDd:         4.64 cm  Diastology LVIDs:         3.17 cm  LV e' lateral:   17.20 cm/s LV PW:         0.86 cm  LV E/e' lateral: 5.5 LV IVS:        0.68 cm  LV e' medial:    12.80 cm/s LVOT diam:     2.00 cm  LV E/e' medial:  7.4 LV SV:         58 LV SV Index:   31 LVOT Area:     3.14 cm  RIGHT VENTRICLE RV Basal diam:  3.14 cm RV S prime:     14.50 cm/s TAPSE (M-mode): 2.6 cm LEFT ATRIUM             Index       RIGHT ATRIUM           Index LA diam:        2.50 cm 1.32 cm/m  RA Area:     14.70 cm LA Vol (A2C):   56.9 ml 30.04 ml/m RA Volume:   38.20 ml  20.17 ml/m LA Vol (A4C):   42.4 ml 22.39 ml/m LA Biplane Vol: 52.0 ml 27.46 ml/m  AORTIC VALVE                   PULMONIC VALVE AV Area (Vmax):    2.52 cm    PV Vmax:        0.67 m/s AV Area (Vmean):   2.29 cm    PV Peak grad:   1.8 mmHg AV Area (VTI):     2.04 cm    RVOT Peak  grad: 2 mmHg AV Vmax:           126.00 cm/s AV Vmean:          92.000 cm/s AV VTI:            0.284 m AV Peak Grad:      6.4 mmHg AV Mean Grad:      4.0 mmHg LVOT Vmax:         101.00 cm/s LVOT Vmean:        67.000 cm/s LVOT VTI:          0.184 m LVOT/AV VTI ratio: 0.65  AORTA Ao Root diam: 2.60 cm MITRAL VALVE               TRICUSPID VALVE MV Area (PHT): 3.24 cm    TR Peak grad:   10.8 mmHg MV Decel Time: 234 msec  TR Vmax:        164.00 cm/s MV E velocity: 94.70 cm/s MV A velocity: 57.80 cm/s  SHUNTS MV E/A ratio:  1.64        Systemic VTI:  0.18 m                            Systemic Diam: 2.00 cm Nelva Bush MD Electronically signed by Nelva Bush MD Signature Date/Time: 06/19/2019/3:31:32 PM    Final      CODE STATUS:     Code Status Orders  (From admission, onward)         Start     Ordered   06/19/19 0510  Full code  Continuous        06/19/19 0512        Code Status History    Date Active Date Inactive Code Status Order ID Comments User Context   02/29/2016 0943 03/03/2016 1730 Full Code [id number]  Ward, Honor Loh, MD Inpatient   Advance Care Planning Activity       TOTAL TIME TAKING CARE OF THIS PATIENT: *40* minutes.    Fritzi Mandes M.D  [facility]    CC: Primary care physician; [facility], [facility]

## 2019-06-21 NOTE — Plan of Care (Signed)

## 2019-06-21 NOTE — Progress Notes (Signed)
Subjective: No further seizures noted.   Objective: Current vital signs: BP (!) 97/45 (BP Location: Right Arm)    Pulse 69    Temp 98.4 F (36.9 C) (Oral)    Resp 18    Ht 5\' 9"  (1.753 m)    Wt 73.9 kg    LMP 05/23/2019 (Exact Date)    SpO2 99%    BMI 24.07 kg/m  Vital signs in last 24 hours: Temp:  [97.4 F (36.3 C)-98.4 F (36.9 C)] 98.4 F (36.9 C) (06/17 0820) Pulse Rate:  [51-70] 69 (06/17 0820) Resp:  [16-20] 18 (06/17 0820) BP: (97-108)/(45-71) 97/45 (06/17 0820) SpO2:  [95 %-100 %] 99 % (06/17 0820)  Intake/Output from previous day: 06/16 0701 - 06/17 0700 In: 120 [P.O.:120] Out: -  Intake/Output this shift: No intake/output data recorded. Nutritional status:  Diet Order            Diet - low sodium heart healthy           Diet regular Room service appropriate? Yes; Fluid consistency: Thin  Diet effective now                 Neurologic Exam: Mental Status: Alert, oriented, thought content appropriate. Speech fluent without evidence of aphasia. Able to follow 3 step commands without difficulty. Cranial Nerves: II: Discs flat bilaterally; Visual fields grossly normal, pupils equal, round, reactive to light and accommodation III,IV, VI: ptosis not present, extra-ocular motions intact bilaterally.  Left periorbital hematoma V,VII: smile symmetric, facial light touch sensation normal bilaterally VIII: hearing normal bilaterally IX,X: gag reflex present XI: bilateral shoulder shrug XII: midline tongue extension Motor: 5/5 throughout Sensory: Pinprick and light touch intact throughout, bilaterally  Lab Results: Basic Metabolic Panel: Recent Labs  Lab 06/17/19 1631 06/18/19 2156  NA 138 138  K 3.7 3.7  CL 107 105  CO2 24 27  GLUCOSE 86 101*  BUN 9 11  CREATININE 0.51 0.61  CALCIUM 8.8* 8.9    Liver Function Tests: Recent Labs  Lab 06/17/19 1631  AST 12*  ALT 10  ALKPHOS 54  BILITOT 0.6  PROT 6.8  ALBUMIN 4.2   Recent Labs  Lab  06/17/19 1631  LIPASE 40   No results for input(s): AMMONIA in the last 168 hours.  CBC: Recent Labs  Lab 06/17/19 1631 06/18/19 2156  WBC 8.5 9.2  HGB 12.9 12.7  HCT 40.1 38.5  MCV 91.3 89.7  PLT 206 217    Cardiac Enzymes: No results for input(s): CKTOTAL, CKMB, CKMBINDEX, TROPONINI in the last 168 hours.  Lipid Panel: No results for input(s): CHOL, TRIG, HDL, CHOLHDL, VLDL, LDLCALC in the last 168 hours.  CBG: Recent Labs  Lab 06/20/19 0340  GLUCAP 101*    Microbiology: Results for orders placed or performed during the hospital encounter of 06/19/19  SARS Coronavirus 2 by RT PCR (hospital order, performed in [facility] hospital lab) Nasopharyngeal Nasopharyngeal Swab     Status: None   Collection Time: 06/19/19  9:25 AM   Specimen: Nasopharyngeal Swab  Result Value Ref Range Status   SARS Coronavirus 2 NEGATIVE NEGATIVE Final    Comment: (NOTE) SARS-CoV-2 target nucleic acids are NOT DETECTED.  The SARS-CoV-2 RNA is generally detectable in upper and lower respiratory specimens during the acute phase of infection. The lowest concentration of SARS-CoV-2 viral copies this assay can detect is 250 copies / mL. A negative result does not preclude SARS-CoV-2 infection and should not be used as the sole basis  for treatment or other patient management decisions.  A negative result may occur with improper specimen collection / handling, submission of specimen other than nasopharyngeal swab, presence of viral mutation(s) within the areas targeted by this assay, and inadequate number of viral copies (<250 copies / mL). A negative result must be combined with clinical observations, patient history, and epidemiological information.  Fact Sheet for Patients:   BoilerBrush.com.cy  Fact Sheet for Healthcare Providers: https://pope.com/  This test is not yet approved or  cleared by the [place] FDA and has been  authorized for detection and/or diagnosis of SARS-CoV-2 by FDA under an Emergency Use Authorization (EUA).  This EUA will remain in effect (meaning this test can be used) for the duration of the COVID-19 declaration under Section 564(b)(1) of the Act, 21 U.S.C. section 360bbb-3(b)(1), unless the authorization is terminated or revoked sooner.  Performed at [facility], [address] [place], [place] [address]     Coagulation Studies: No results for input(s): LABPROT, INR in the last 72 hours.  Imaging: EEG  Result Date: 06/19/2019 Charlsie Quest, MD     06/19/2019  5:48 PM Patient Name: Mary Wong MRN: [id number] Epilepsy Attending: Charlsie Quest Referring Physician/Provider: Dr Lindajo Royal Date: 06/19/2019 Duration: 21.19 mins Patient history: 29 y.o. female withno significant past medical history who presented to the emergency room following a syncopal episode vs seizure event. Level of alertness: Awake, asleep, comatose, lethargic AEDs during EEG study: Technical aspects: This EEG study was done with scalp electrodes positioned according to the 10-20 International system of electrode placement. Electrical activity was acquired at a sampling rate of 500Hz  and reviewed with a high frequency filter of 70Hz  and a low frequency filter of 1Hz . EEG data were recorded continuously and digitally stored. Description: The posterior dominant rhythm consists of 9.5 Hz activity of moderate voltage (25-35 uV) seen predominantly in posterior head regions, symmetric and reactive to eye opening and eye closing. Sleep was characterized by vertex waves, sleep spindles (12 to 14 Hz), maximal frontocentral region. EEG showed generalized, maximal bifrontal polyspike and wave at 4hz , more frequently during sleep.  Physiology photic driving was not seen during photic stimulation.  Hyperventilation was not performed.   ABNORMALITY -Polyspike and wave, generalized IMPRESSION: This study showed  evidence of generalized epilepsy, most like Juvenile Myoclonic epilepsy but can also be secondary to focal epilepsy with frontal onset. No seizures were seen throughout the recording. Dr was notified.   MR BRAIN WO CONTRAST  Result Date: 06/19/2019 CLINICAL DATA:  Seizure. EXAM: MRI HEAD WITHOUT CONTRAST TECHNIQUE: Multiplanar, multiecho pulse sequences of the brain and surrounding structures were obtained without intravenous contrast. COMPARISON:  Head CT 06/18/2019 FINDINGS: The study is mildly motion degraded despite repeated imaging attempts. Brain: Dedicated thin section imaging through the temporal lobes demonstrates normal volume and signal of the hippocampi. There is no evidence of heterotopia or cortical dysplasia. There is no evidence of acute infarct, intracranial hemorrhage, mass, midline shift, or extra-axial fluid collection. The ventricles and sulci are normal. The brain is normal in signal. Vascular: Major intracranial vascular flow voids are preserved. Skull and upper cervical spine: Unremarkable bone marrow signal. Sinuses/Orbits: Unremarkable orbits. Paranasal sinuses and mastoid air cells are clear. Other: None. IMPRESSION: Negative brain MRI. Electronically Signed   By: Loretha Brasil M.D.   On: 06/19/2019 13:33   ECHOCARDIOGRAM COMPLETE  Result Date: 06/19/2019    ECHOCARDIOGRAM REPORT   Patient Name:   Mary  Inocencio Wong Date of Exam: 06/19/2019 Medical Rec #:  [id number]         Height:       69.0 in Accession #:    [id number]        Weight:       163.0 lb Date of Birth:  1990-02-24         BSA:          1.894 m Patient Age:    29 years          BP:           Not listed in chart/Not listed                                                 in chart mmHg Patient Gender: F                 HR:           55 bpm. Exam Location:  [facility] Procedure: 2D Echo, Cardiac Doppler, Color Doppler and Intracardiac            Opacification Agent Indications:     Syncope 780.2  History:          Patient has no prior history of Echocardiogram examinations. No                  cardiac history listed in chart.  Sonographer:     Sherrie Sport RDCS (AE) Referring Phys:  [id number] Athena Masse Diagnosing Phys: Nelva Bush MD IMPRESSIONS  1. Left ventricular ejection fraction, by estimation, is 55 to 60%. The left ventricle has normal function. The left ventricle has no regional wall motion abnormalities. Left ventricular diastolic parameters were normal.  2. Right ventricular systolic function is normal. The right ventricular size is normal. There is normal pulmonary artery systolic pressure.  3. The mitral valve is normal in structure. No evidence of mitral valve regurgitation. No evidence of mitral stenosis.  4. The aortic valve has an indeterminant number of cusps. Aortic valve regurgitation is not visualized. No aortic stenosis is present.  5. The inferior vena cava is normal in size with greater than 50% respiratory variability, suggesting right atrial pressure of 3 mmHg. FINDINGS  Left Ventricle: Left ventricular ejection fraction, by estimation, is 55 to 60%. The left ventricle has normal function. The left ventricle has no regional wall motion abnormalities. Definity contrast agent was given IV to delineate the left ventricular  endocardial borders. The left ventricular internal cavity size was normal in size. There is no left ventricular hypertrophy. Left ventricular diastolic parameters were normal. Right Ventricle: The right ventricular size is normal. No increase in right ventricular wall thickness. Right ventricular systolic function is normal. There is normal pulmonary artery systolic pressure. The tricuspid regurgitant velocity is 1.64 m/s, and  with an assumed right atrial pressure of 3 mmHg, the estimated right ventricular systolic pressure is 38.7 mmHg. Left Atrium: Left atrial size was normal in size. Right Atrium: Right atrial size was normal in size. Pericardium: There is no evidence  of pericardial effusion. Mitral Valve: The mitral valve is normal in structure. No evidence of mitral valve regurgitation. No evidence of mitral valve stenosis. Tricuspid Valve: The tricuspid valve is normal in structure. Tricuspid valve regurgitation is not demonstrated. Aortic Valve: The aortic valve has an indeterminant number of  cusps. Aortic valve regurgitation is not visualized. No aortic stenosis is present. Aortic valve mean gradient measures 4.0 mmHg. Aortic valve peak gradient measures 6.4 mmHg. Aortic valve area, by VTI measures 2.04 cm. Pulmonic Valve: The pulmonic valve was not well visualized. Pulmonic valve regurgitation is not visualized. No evidence of pulmonic stenosis. Aorta: The aortic root is normal in size and structure. Pulmonary Artery: The pulmonary artery is not well seen. Venous: The inferior vena cava is normal in size with greater than 50% respiratory variability, suggesting right atrial pressure of 3 mmHg. IAS/Shunts: The interatrial septum was not well visualized.  LEFT VENTRICLE PLAX 2D LVIDd:         4.64 cm  Diastology LVIDs:         3.17 cm  LV e' lateral:   17.20 cm/s LV PW:         0.86 cm  LV E/e' lateral: 5.5 LV IVS:        0.68 cm  LV e' medial:    12.80 cm/s LVOT diam:     2.00 cm  LV E/e' medial:  7.4 LV SV:         58 LV SV Index:   31 LVOT Area:     3.14 cm  RIGHT VENTRICLE RV Basal diam:  3.14 cm RV S prime:     14.50 cm/s TAPSE (M-mode): 2.6 cm LEFT ATRIUM             Index       RIGHT ATRIUM           Index LA diam:        2.50 cm 1.32 cm/m  RA Area:     14.70 cm LA Vol (A2C):   56.9 ml 30.04 ml/m RA Volume:   38.20 ml  20.17 ml/m LA Vol (A4C):   42.4 ml 22.39 ml/m LA Biplane Vol: 52.0 ml 27.46 ml/m  AORTIC VALVE                   PULMONIC VALVE AV Area (Vmax):    2.52 cm    PV Vmax:        0.67 m/s AV Area (Vmean):   2.29 cm    PV Peak grad:   1.8 mmHg AV Area (VTI):     2.04 cm    RVOT Peak grad: 2 mmHg AV Vmax:           126.00 cm/s AV Vmean:           92.000 cm/s AV VTI:            0.284 m AV Peak Grad:      6.4 mmHg AV Mean Grad:      4.0 mmHg LVOT Vmax:         101.00 cm/s LVOT Vmean:        67.000 cm/s LVOT VTI:          0.184 m LVOT/AV VTI ratio: 0.65  AORTA Ao Root diam: 2.60 cm MITRAL VALVE               TRICUSPID VALVE MV Area (PHT): 3.24 cm    TR Peak grad:   10.8 mmHg MV Decel Time: 234 msec    TR Vmax:        164.00 cm/s MV E velocity: 94.70 cm/s MV A velocity: 57.80 cm/s  SHUNTS MV E/A ratio:  1.64        Systemic VTI:  0.18 m  Systemic Diam: 2.00 cm Yvonne Kendall MD Electronically signed by Yvonne Kendall MD Signature Date/Time: 06/19/2019/3:31:32 PM    Final     Medications:  I have reviewed the patient's current medications. Scheduled:  enoxaparin (LOVENOX) injection  40 mg Subcutaneous Q24H   levETIRAcetam  500 mg Oral BID   pantoprazole  40 mg Oral Daily   sodium chloride flush  3 mL Intravenous Q12H    Assessment/Plan: 29 y.o.femalewithno significant past medical history who presented to the emergency room following a syncopal episodevs seizure event. No history of seizuresprior.Has been stable with no recurrent events since admission.  MRI of the brain personally reviewed and unremarkable.  EEG consistent with JME.  Keppra initiated.  No side effects reported.   Recommendations: 1. Continue Keppra at 500mg  BID 2. Seizure precautions 3. Patient to follow up with neurology after discharge as an outpatient 4. Patient unable to drive, operate heavy machinery, perform activities at heights and participate in water activities until release by outpatient physician.   LOS: 1 day   , MD Neurology [phone] 06/21/2019  9:16 AM

## 2019-06-21 NOTE — Plan of Care (Signed)
Problem: Education: Goal: Knowledge of General Education information will improve Description: Including pain rating scale, medication(s)/side effects and non-pharmacologic comfort measures 06/21/2019 1134 by Orvan Seen, RN Outcome: Adequate for Discharge 06/21/2019 1134 by Orvan Seen, RN Outcome: Progressing   Problem: Health Behavior/Discharge Planning: Goal: Ability to manage health-related needs will improve 06/21/2019 1134 by Orvan Seen, RN Outcome: Adequate for Discharge 06/21/2019 1134 by Orvan Seen, RN Outcome: Progressing   Problem: Clinical Measurements: Goal: Ability to maintain clinical measurements within normal limits will improve 06/21/2019 1134 by Orvan Seen, RN Outcome: Adequate for Discharge 06/21/2019 1134 by Orvan Seen, RN Outcome: Progressing Goal: Will remain free from infection 06/21/2019 1134 by Orvan Seen, RN Outcome: Adequate for Discharge 06/21/2019 1134 by Orvan Seen, RN Outcome: Progressing Goal: Diagnostic test results will improve 06/21/2019 1134 by Orvan Seen, RN Outcome: Adequate for Discharge 06/21/2019 1134 by Orvan Seen, RN Outcome: Progressing Goal: Respiratory complications will improve 06/21/2019 1134 by Orvan Seen, RN Outcome: Adequate for Discharge 06/21/2019 1134 by Orvan Seen, RN Outcome: Progressing Goal: Cardiovascular complication will be avoided 06/21/2019 1134 by Orvan Seen, RN Outcome: Adequate for Discharge 06/21/2019 1134 by Orvan Seen, RN Outcome: Progressing   Problem: Activity: Goal: Risk for activity intolerance will decrease 06/21/2019 1134 by Orvan Seen, RN Outcome: Adequate for Discharge 06/21/2019 1134 by Orvan Seen, RN Outcome: Progressing   Problem: Nutrition: Goal: Adequate nutrition will be maintained 06/21/2019 1134 by Orvan Seen, RN Outcome: Adequate for Discharge 06/21/2019 1134 by Orvan Seen, RN Outcome: Progressing    Problem: Coping: Goal: Level of anxiety will decrease 06/21/2019 1134 by Orvan Seen, RN Outcome: Adequate for Discharge 06/21/2019 1134 by Orvan Seen, RN Outcome: Progressing   Problem: Elimination: Goal: Will not experience complications related to bowel motility 06/21/2019 1134 by Orvan Seen, RN Outcome: Adequate for Discharge 06/21/2019 1134 by Orvan Seen, RN Outcome: Progressing Goal: Will not experience complications related to urinary retention 06/21/2019 1134 by Orvan Seen, RN Outcome: Adequate for Discharge 06/21/2019 1134 by Orvan Seen, RN Outcome: Progressing   Problem: Pain Managment: Goal: General experience of comfort will improve 06/21/2019 1134 by Orvan Seen, RN Outcome: Adequate for Discharge 06/21/2019 1134 by Orvan Seen, RN Outcome: Progressing   Problem: Safety: Goal: Ability to remain free from injury will improve 06/21/2019 1134 by Orvan Seen, RN Outcome: Adequate for Discharge 06/21/2019 1134 by Orvan Seen, RN Outcome: Progressing   Problem: Skin Integrity: Goal: Risk for impaired skin integrity will decrease 06/21/2019 1134 by Orvan Seen, RN Outcome: Adequate for Discharge 06/21/2019 1134 by Orvan Seen, RN Outcome: Progressing   Problem: Education: Goal: Expressions of having a comfortable level of knowledge regarding the disease process will increase 06/21/2019 1134 by Orvan Seen, RN Outcome: Adequate for Discharge 06/21/2019 1134 by Orvan Seen, RN Outcome: Progressing   Problem: Coping: Goal: Ability to adjust to condition or change in health will improve 06/21/2019 1134 by Orvan Seen, RN Outcome: Adequate for Discharge 06/21/2019 1134 by Orvan Seen, RN Outcome: Progressing Goal: Ability to identify appropriate support needs will improve 06/21/2019 1134 by Orvan Seen, RN Outcome: Adequate for Discharge 06/21/2019 1134 by Orvan Seen, RN Outcome: Progressing    Problem: Health Behavior/Discharge Planning: Goal: Compliance with prescribed medication regimen will improve 06/21/2019 1134 by Orvan Seen, RN Outcome: Adequate for Discharge 06/21/2019 1134 by Jed Limerick,  [place], RN Outcome: Progressing   Problem: Medication: Goal: Risk for medication side effects will decrease 06/21/2019 1134 by Jenene Slicker, RN Outcome: Adequate for Discharge 06/21/2019 1134 by Jenene Slicker, RN Outcome: Progressing   Problem: Clinical Measurements: Goal: Complications related to the disease process, condition or treatment will be avoided or minimized 06/21/2019 1134 by Jenene Slicker, RN Outcome: Adequate for Discharge 06/21/2019 1134 by Jenene Slicker, RN Outcome: Progressing Goal: Diagnostic test results will improve 06/21/2019 1134 by Jenene Slicker, RN Outcome: Adequate for Discharge 06/21/2019 1134 by Jenene Slicker, RN Outcome: Progressing   Problem: Safety: Goal: Verbalization of understanding the information provided will improve 06/21/2019 1134 by Jenene Slicker, RN Outcome: Adequate for Discharge 06/21/2019 1134 by Jenene Slicker, RN Outcome: Progressing   Problem: Self-Concept: Goal: Level of anxiety will decrease 06/21/2019 1134 by Jenene Slicker, RN Outcome: Adequate for Discharge 06/21/2019 1134 by Jenene Slicker, RN Outcome: Progressing Goal: Ability to verbalize feelings about condition will improve 06/21/2019 1134 by Jenene Slicker, RN Outcome: Adequate for Discharge 06/21/2019 1134 by Jenene Slicker, RN Outcome: Progressing

## 2019-06-21 NOTE — Progress Notes (Signed)
Patient discharged home via POV. After Visit Summary reviewed with patient. Patient had no questions at this time. 

## 2019-08-12 ENCOUNTER — Emergency Department
Admission: EM | Admit: 2019-08-12 | Discharge: 2019-08-12 | Disposition: A | Discharge status: Home / Self Care | Payer: Medicaid Other | Attending: Emergency Medicine | Admitting: Emergency Medicine

## 2019-08-12 ENCOUNTER — Other Ambulatory Visit: Payer: Self-pay

## 2019-08-12 DIAGNOSIS — R569 Unspecified convulsions: Secondary | ICD-10-CM | POA: Diagnosis present

## 2019-08-12 DIAGNOSIS — F17210 Nicotine dependence, cigarettes, uncomplicated: Secondary | ICD-10-CM | POA: Insufficient documentation

## 2019-08-12 LAB — CBC
HCT: 37.6 % (ref 36.0–46.0)
Hemoglobin: 12.4 g/dL (ref 12.0–15.0)
MCH: 30.2 pg (ref 26.0–34.0)
MCHC: 33 g/dL (ref 30.0–36.0)
MCV: 91.5 fL (ref 80.0–100.0)
Platelets: 251 10*3/uL (ref 150–400)
RBC: 4.11 MIL/uL (ref 3.87–5.11)
RDW: 13.2 % (ref 11.5–15.5)
WBC: 7.7 10*3/uL (ref 4.0–10.5)
nRBC: 0 % (ref 0.0–0.2)

## 2019-08-12 LAB — URINE DRUG SCREEN, QUALITATIVE (ARMC ONLY)
Amphetamines, Ur Screen: NOT DETECTED
Barbiturates, Ur Screen: NOT DETECTED
Benzodiazepine, Ur Scrn: NOT DETECTED
Cannabinoid 50 Ng, Ur ~~LOC~~: POSITIVE — AB
Cocaine Metabolite,Ur ~~LOC~~: NOT DETECTED
MDMA (Ecstasy)Ur Screen: NOT DETECTED
Methadone Scn, Ur: NOT DETECTED
Opiate, Ur Screen: NOT DETECTED
Phencyclidine (PCP) Ur S: NOT DETECTED
Tricyclic, Ur Screen: NOT DETECTED

## 2019-08-12 LAB — URINALYSIS, COMPLETE (UACMP) WITH MICROSCOPIC
Bilirubin Urine: NEGATIVE
Glucose, UA: NEGATIVE mg/dL
Hgb urine dipstick: NEGATIVE
Ketones, ur: NEGATIVE mg/dL
Leukocytes,Ua: NEGATIVE
Nitrite: NEGATIVE
Protein, ur: NEGATIVE mg/dL
Specific Gravity, Urine: 1.021 (ref 1.005–1.030)
pH: 7 (ref 5.0–8.0)

## 2019-08-12 LAB — BASIC METABOLIC PANEL
Anion gap: 7 (ref 5–15)
BUN: 8 mg/dL (ref 6–20)
CO2: 27 mmol/L (ref 22–32)
Calcium: 8.9 mg/dL (ref 8.9–10.3)
Chloride: 105 mmol/L (ref 98–111)
Creatinine, Ser: 0.78 mg/dL (ref 0.44–1.00)
GFR calc Af Amer: >60 mL/min (ref >60)
GFR calc non Af Amer: >60 mL/min (ref >60)
Glucose, Bld: 83 mg/dL (ref 70–99)
Potassium: 3.7 mmol/L (ref 3.5–5.1)
Sodium: 139 mmol/L (ref 135–145)

## 2019-08-12 LAB — ETHANOL: Alcohol, Ethyl (B): <10 mg/dL (ref <10)

## 2019-08-12 MED ORDER — SODIUM CHLORIDE 0.9 % IV SOLN
100.0000 mg | Freq: Once | INTRAVENOUS | Status: AC
  Administered 2019-08-12: 100 mg via INTRAVENOUS
  Filled 2019-08-12: qty 10

## 2019-08-12 MED ORDER — VIMPAT 100 MG PO TABS: 100.0000 mg | ORAL_TABLET | Freq: Two times a day (BID) | ORAL | 0 refills | Status: DC

## 2019-08-12 NOTE — ED Notes (Signed)
Reviewed discharge instructions, follow-up care, and prescriptions with patient. Patient verbalized understanding of all information reviewed. Patient stable, with no distress noted at this time.    

## 2019-08-12 NOTE — ED Notes (Signed)
Pharmacy notified for med

## 2019-08-12 NOTE — ED Triage Notes (Signed)
Pt arrives via ems from home post seizure, pt was diagnosed with "silent seizures" 6 weeks ago and placed on lamictal. Pt appears to be post ictal at this time. No obvious injuries noticed, ems reports that the pt initially c/o headache and laid down

## 2019-08-12 NOTE — ED Provider Notes (Signed)
[facility] Emergency Department Provider Note  ____________________________________________   I have reviewed the triage vital signs and the nursing notes.   HISTORY  Chief Complaint Seizures   History limited by: Not Limited   HPI Mary Wong is a 29 y.o. female who presents to the emergency department today after apparent seizure.  The patient herself does not remember having a seizure.  She does state that she felt in her normal state of health this morning.  She was recently diagnosed with seizures and states she is compliant with her medications.  She is being increased on her Lamictal.  She denies any sleep deprivation.  Denies any alcohol use. States she has a slight headache at this time.   Records reviewed. Per medical record review patient has a history of recent diagnosis of seizure,   Past Medical History:  Diagnosis Date  . Medical history non-contributory   . Ovarian cyst   . Scoliosis     Patient Active Problem List   Diagnosis Date Noted  . Nonintractable juvenile myoclonic epilepsy without status epilepticus (HCC)   . Blunt trauma of face   . Syncope and collapse 06/19/2019  . Observed seizure-like activity (HCC) 06/19/2019  . Periorbital contusion of left eye 06/19/2019  . Seizure (HCC) 06/19/2019  . Preterm labor in third trimester with preterm delivery 02/29/2016  . Malpresentation of fetus, fetus 2 of multiple gestation 02/29/2016  . Discordant fetal growth in twin gestation, third trimester 02/29/2016  . Depression affecting pregnancy 02/29/2016  . Anemia affecting pregnancy 02/29/2016  . Smoking (tobacco) complicating pregnancy, third trimester 02/29/2016  . History of preterm delivery, currently pregnant 02/29/2016  . Insufficient prenatal care 02/29/2016  . Twin newborn resulting from both spontaneous ovulation and conception, delivered by cesarean in hospital 02/29/2016  . S/P cesarean section 02/29/2016  . Twin  pregnancy, twins dichorionic and diamniotic 11/06/2015    Past Surgical History:  Procedure Laterality Date  . CESAREAN SECTION WITH BILATERAL TUBAL LIGATION N/A 02/29/2016   Procedure: CESAREAN SECTION WITH BILATERAL TUBAL LIGATION;  Surgeon: Elenora Fender Ward, MD;  Location: [facility] ORS;  Service: Obstetrics;  Laterality: N/A;  . NO PAST SURGERIES      Prior to Admission medications   Medication Sig Start Date End Date Taking? Authorizing Provider  levETIRAcetam (KEPPRA) 500 MG tablet Take 1 tablet (500 mg total) by mouth 2 (two) times daily. 06/21/19   Enedina Finner, MD  omeprazole (PRILOSEC OTC) 20 MG tablet Take 1 tablet (20 mg total) by mouth daily. 06/17/19 08/16/19  Menshew, Charlesetta Ivory, PA-C  zolpidem (AMBIEN) 10 MG tablet Take 10 mg by mouth at bedtime. 05/13/19   [provider]    Allergies Patient has no known allergies.  No family history on file.  Social History Social History   Tobacco Use  . Smoking status: Current Every Day Smoker    Packs/day: 0.50    Types: Cigarettes  . Smokeless tobacco: Never Used  Substance Use Topics  . Alcohol use: No  . Drug use: No    Review of Systems Constitutional: No fever/chills Eyes: No visual changes. ENT: No sore throat. Cardiovascular: Denies chest pain. Respiratory: Denies shortness of breath. Gastrointestinal: No abdominal pain.  No nausea, no vomiting.  No diarrhea.   Genitourinary: Negative for dysuria. Musculoskeletal: Negative for back pain. Skin: Negative for rash. Neurological: Positive for headache. ____________________________________________   PHYSICAL EXAM:  VITAL SIGNS: ED Triage Vitals  Enc Vitals Group     BP  08/12/19 1610 113/63     Pulse Rate 08/12/19 1610 96     Resp 08/12/19 1610 16     Temp 08/12/19 1610 98.5 F (36.9 C)     Temp Source 08/12/19 1610 Oral     SpO2 08/12/19 1610 100 %     Weight 08/12/19 1611 160 lb (72.6 kg)     Height 08/12/19 1611 5\' 9"  (1.753 m)     Head  Circumference --      Peak Flow --      Pain Score 08/12/19 1611 Asleep   Constitutional: Alert and oriented.  Eyes: Conjunctivae are normal.  ENT      Head: Normocephalic and atraumatic.      Nose: No congestion/rhinnorhea.      Mouth/Throat: Mucous membranes are moist.      Neck: No stridor. Hematological/Lymphatic/Immunilogical: No cervical lymphadenopathy. Cardiovascular: Normal rate, regular rhythm.  No murmurs, rubs, or gallops.  Respiratory: Normal respiratory effort without tachypnea nor retractions. Breath sounds are clear and equal bilaterally. No wheezes/rales/rhonchi. Gastrointestinal: Soft and non tender. No rebound. No guarding.  Genitourinary: Deferred Musculoskeletal: Normal range of motion in all extremities. No lower extremity edema. Neurologic:  Normal speech and language. No gross focal neurologic deficits are appreciated.  Skin:  Skin is warm, dry and intact. No rash noted. Psychiatric: Mood and affect are normal. Speech and behavior are normal. Patient exhibits appropriate insight and judgment.  ____________________________________________    LABS (pertinent positives/negatives)  CBC wbc 7.7, hgb 12.4, plt 251 BMP wnl UA turbid, rbc 6-10, rare bacteria ____________________________________________   EKG  None  ____________________________________________    RADIOLOGY  None  ____________________________________________   PROCEDURES  Procedures  ____________________________________________   INITIAL IMPRESSION / ASSESSMENT AND PLAN / ED COURSE  Pertinent labs & imaging results that were available during my care of the patient were reviewed by me and considered in my medical decision making (see chart for details).   Patient presented to the emergency department today because of concerns for seizure.  Patient was recently diagnosed with seizures and currently being increased on her lamictal dose.  She states she has been taking her  medication.  Discussed with neurology on-call who recommended adding Vimpat until she gets to full strength on her Lamictal.  Patient was given dose of Vimpat here.  Patient was observed and returned to mental baseline.  Will plan on discharging with prescription for Vimpat.  ____________________________________________   FINAL CLINICAL IMPRESSION(S) / ED DIAGNOSES  Final diagnoses:  Seizure (HCC)     Note: This dictation was prepared with Dragon dictation. Any transcriptional errors that result from this process are unintentional     10/12/19, MD 08/12/19 1905

## 2019-08-12 NOTE — Discharge Instructions (Addendum)
As we discussed please call your neurologist. Please do not drive. Please seek medical attention for any high fevers, chest pain, shortness of breath, change in behavior, persistent vomiting, bloody stool or any other new or concerning symptoms.

## 2019-08-12 NOTE — ED Notes (Signed)
Pt POC urine pregnancy was negative.  Pt was in no distress and rested comfortably here while in the ED.  Pt denies any injury

## 2019-08-13 LAB — URINE CULTURE

## 2019-08-27 ENCOUNTER — Emergency Department
Admission: EM | Admit: 2019-08-27 | Discharge: 2019-08-27 | Disposition: A | Discharge status: Home / Self Care | Payer: Medicaid Other

## 2019-08-27 NOTE — ED Triage Notes (Signed)
107/65, HR 78, 98%RA. Pt reports HA

## 2019-08-28 ENCOUNTER — Emergency Department
Admission: EM | Admit: 2019-08-28 | Discharge: 2019-08-28 | Disposition: A | Discharge status: Home / Self Care | Payer: Medicaid Other | Attending: Emergency Medicine | Admitting: Emergency Medicine

## 2019-08-28 ENCOUNTER — Emergency Department: Payer: Medicaid Other

## 2019-08-28 DIAGNOSIS — F17210 Nicotine dependence, cigarettes, uncomplicated: Secondary | ICD-10-CM | POA: Insufficient documentation

## 2019-08-28 DIAGNOSIS — R519 Headache, unspecified: Secondary | ICD-10-CM | POA: Diagnosis present

## 2019-08-28 DIAGNOSIS — Z79899 Other long term (current) drug therapy: Secondary | ICD-10-CM | POA: Diagnosis not present

## 2019-08-28 DIAGNOSIS — Z8669 Personal history of other diseases of the nervous system and sense organs: Secondary | ICD-10-CM | POA: Insufficient documentation

## 2019-08-28 MED ORDER — DIPHENHYDRAMINE HCL 50 MG/ML IJ SOLN
50.0000 mg | Freq: Once | INTRAMUSCULAR | Status: AC
  Administered 2019-08-28: 50 mg via INTRAVENOUS
  Filled 2019-08-28: qty 1

## 2019-08-28 MED ORDER — KETOROLAC TROMETHAMINE 30 MG/ML IJ SOLN
30.0000 mg | Freq: Once | INTRAMUSCULAR | Status: AC
  Administered 2019-08-28: 30 mg via INTRAVENOUS
  Filled 2019-08-28: qty 1

## 2019-08-28 MED ORDER — SODIUM CHLORIDE 0.9 % IV BOLUS
1000.0000 mL | Freq: Once | INTRAVENOUS | Status: AC
  Administered 2019-08-28: 1000 mL via INTRAVENOUS

## 2019-08-28 MED ORDER — BUTALBITAL-APAP-CAFFEINE 50-325-40 MG PO TABS: 1.0000 | ORAL_TABLET | Freq: Four times a day (QID) | ORAL | 0 refills | Status: AC | PRN

## 2019-08-28 MED ORDER — METOCLOPRAMIDE HCL 5 MG/ML IJ SOLN
10.0000 mg | Freq: Once | INTRAMUSCULAR | Status: AC
  Administered 2019-08-28: 10 mg via INTRAVENOUS
  Filled 2019-08-28: qty 2

## 2019-08-28 NOTE — ED Triage Notes (Signed)
Pt to ed with c/o headache x 2 days. Pt states she was seen at [facility] today for same and was sent here from there for her pain. Pt rates pain 10/10 at this time. Reports photosensitivity.  Pt reports recent onset of seizures in the last several months and she had one yesterday and then the headache started.

## 2019-08-28 NOTE — ED Provider Notes (Signed)
[facility] Emergency Department Provider Note  Time seen: 12:12 PM  I have reviewed the triage vital signs and the nursing notes.   HISTORY  Chief Complaint Headache   HPI Mary Wong is a 29 y.o. female with a past medical history of seizures, anemia, headaches, presents to the emergency department with a headache. According to the patient she has epilepsy and has been having increased seizures recently. States she hit her head yesterday while having a seizure and since has been experiencing a headache. States she went to her neurologist today, and said she felt like she was going to have another seizure because of her headache so they sent her to the emergency department for evaluation. Patient states 8/10 headache, aching type quality, states it feels similar to past headaches she has experienced. States photosensitivity, denies any significant nausea, no vomiting. No diarrhea. No cough fever or shortness of breath.   Past Medical History:  Diagnosis Date  . Medical history non-contributory   . Ovarian cyst   . Scoliosis     Patient Active Problem List   Diagnosis Date Noted  . Nonintractable juvenile myoclonic epilepsy without status epilepticus (HCC)   . Blunt trauma of face   . Syncope and collapse 06/19/2019  . Observed seizure-like activity (HCC) 06/19/2019  . Periorbital contusion of left eye 06/19/2019  . Seizure (HCC) 06/19/2019  . Preterm labor in third trimester with preterm delivery 02/29/2016  . Malpresentation of fetus, fetus 2 of multiple gestation 02/29/2016  . Discordant fetal growth in twin gestation, third trimester 02/29/2016  . Depression affecting pregnancy 02/29/2016  . Anemia affecting pregnancy 02/29/2016  . Smoking (tobacco) complicating pregnancy, third trimester 02/29/2016  . History of preterm delivery, currently pregnant 02/29/2016  . Insufficient prenatal care 02/29/2016  . Twin newborn resulting from both  spontaneous ovulation and conception, delivered by cesarean in hospital 02/29/2016  . S/P cesarean section 02/29/2016  . Twin pregnancy, twins dichorionic and diamniotic 11/06/2015    Past Surgical History:  Procedure Laterality Date  . CESAREAN SECTION WITH BILATERAL TUBAL LIGATION N/A 02/29/2016   Procedure: CESAREAN SECTION WITH BILATERAL TUBAL LIGATION;  Surgeon: Elenora Fender Ward, MD;  Location: [facility] ORS;  Service: Obstetrics;  Laterality: N/A;  . NO PAST SURGERIES      Prior to Admission medications   Medication Sig Start Date End Date Taking? Authorizing Provider  Lacosamide (VIMPAT) 100 MG TABS Take 1 tablet (100 mg total) by mouth in the morning and at bedtime for 21 days. 08/12/19 09/02/19  Phineas Semen, MD  levETIRAcetam (KEPPRA) 500 MG tablet Take 1 tablet (500 mg total) by mouth 2 (two) times daily. 06/21/19   Enedina Finner, MD  omeprazole (PRILOSEC OTC) 20 MG tablet Take 1 tablet (20 mg total) by mouth daily. 06/17/19 08/16/19  Menshew, Charlesetta Ivory, PA-C  zolpidem (AMBIEN) 10 MG tablet Take 10 mg by mouth at bedtime. 05/13/19   [provider]    No Known Allergies  No family history on file.  Social History Social History   Tobacco Use  . Smoking status: Current Every Day Smoker    Packs/day: 0.50    Types: Cigarettes  . Smokeless tobacco: Never Used  Substance Use Topics  . Alcohol use: No  . Drug use: No    Review of Systems Constitutional: Negative for fever. Cardiovascular: Negative for chest pain. Respiratory: Negative for shortness of breath. Gastrointestinal: Negative for abdominal pain Musculoskeletal: Negative for musculoskeletal complaints Neurological: Negative for headache All  other ROS negative  ____________________________________________   PHYSICAL EXAM:  VITAL SIGNS: ED Triage Vitals  Enc Vitals Group     BP 08/28/19 0916 118/76     Pulse Rate 08/28/19 0916 70     Resp 08/28/19 0916 18     Temp 08/28/19 0916 98 F (36.7 C)      Temp Source 08/28/19 0916 Oral     SpO2 08/28/19 0916 100 %     Weight 08/28/19 0917 160 lb 0.9 oz (72.6 kg)     Height 08/28/19 0917 5\' 9"  (1.753 m)     Head Circumference --      Peak Flow --      Pain Score 08/28/19 0916 10     Pain Loc --      Pain Edu? --      Excl. in GC? --    Constitutional: Alert and oriented. Well appearing and in no distress. Eyes: Normal exam ENT      Head: Normocephalic and atraumatic.      Mouth/Throat: Mucous membranes are moist. Cardiovascular: Normal rate, regular rhythm.  Respiratory: Normal respiratory effort without tachypnea nor retractions. Breath sounds are clear Gastrointestinal: Soft and nontender. No distention.   Musculoskeletal: Nontender with normal range of motion in all extremities.  Neurologic:  Normal speech and language. No gross focal neurologic deficits. Equal grip strengths. Skin:  Skin is warm, dry and intact.  Psychiatric: Mood and affect are normal  ____________________________________________   RADIOLOGY  CT head is negative for acute abnormality.  ____________________________________________   INITIAL IMPRESSION / ASSESSMENT AND PLAN / ED COURSE  Pertinent labs & imaging results that were available during my care of the patient were reviewed by me and considered in my medical decision making (see chart for details).   Patient presents emergency department for an ongoing headache since yesterday after hitting her head during a seizure. Patient states she felt like she is going to have a seizure today to the headaches that they sent her to the emergency department for evaluation. The patient appears well, no acute distress, does have photophobia on exam otherwise intact neurological exam. States 8/10 headache. States feels similar to past headaches. We will check a CT scan of the head as a precaution to rule out intracranial abnormality such as hemorrhage or mass. We will treat with Toradol Reglan Benadryl as well as IV  fluids and reassess. Patient agreeable to plan of care.  CT scan of the head is negative.  Patient states she is feeling much better.  We will discharge from the emergency department with a Fioricet prescription and neurology follow-up.  Patient agreeable to plan of care.  Mary Wong was evaluated in Emergency Department on 08/28/2019 for the symptoms described in the history of present illness. She was evaluated in the context of the global COVID-19 pandemic, which necessitated consideration that the patient might be at risk for infection with the SARS-CoV-2 virus that causes COVID-19. Institutional protocols and algorithms that pertain to the evaluation of patients at risk for COVID-19 are in a state of rapid change based on information released by regulatory bodies including the CDC and federal and state organizations. These policies and algorithms were followed during the patient's care in the ED.  ____________________________________________   FINAL CLINICAL IMPRESSION(S) / ED DIAGNOSES  Headache   08/30/2019, MD 08/28/19 1432

## 2019-08-28 NOTE — ED Notes (Signed)
Patient refused to stay for d/c vitals. Patient called out multiple times to leave and have IV taken out. Patient reported to this RN her headache was not better but she was leaving to go home and sleep in her bed. MD made aware and wrote up d/c papers and IV removed

## 2020-03-13 DIAGNOSIS — F0781 Postconcussional syndrome: Secondary | ICD-10-CM | POA: Insufficient documentation

## 2020-10-29 ENCOUNTER — Other Ambulatory Visit: Payer: Self-pay

## 2020-10-29 ENCOUNTER — Ambulatory Visit: Payer: Medicaid Other | Admitting: Nurse Practitioner

## 2020-10-29 ENCOUNTER — Encounter: Payer: Self-pay | Admitting: Nurse Practitioner

## 2020-10-29 VITALS — BP 110/68 | HR 97 | Temp 98.4°F | Resp 16 | Ht 69.0 in | Wt 152.0 lb

## 2020-10-29 DIAGNOSIS — L84 Corns and callosities: Secondary | ICD-10-CM

## 2020-10-29 DIAGNOSIS — Z7689 Persons encountering health services in other specified circumstances: Secondary | ICD-10-CM | POA: Diagnosis not present

## 2020-10-29 NOTE — Progress Notes (Signed)
BP 110/68   Pulse 97   Temp 98.4 F (36.9 C)   Resp 16   Ht 5\' 9"  (1.753 m)   Wt 152 lb (68.9 kg)   LMP 10/28/2020   SpO2 100%   BMI 22.45 kg/m    Subjective:    Patient ID: [id number], female    DOB: 1990/06/26, 30 y.o.   MRN: 1990  HPI: Mary Wong is a 30 y.o. female, here alone  Chief Complaint  Patient presents with   calus   Establishing care: [facility], will request records. Last visit sometime in the last year.  History of seizure April 2022.  Currently taking Lamictal.  Sees neurologist Dr. May 2022, last seen 07/01/20.  Callous/corn: She reports a knot on the bottom of her left foot.  She says it has been there for about two months.  She says it is painful to walk on.  Discussed using OTC corn pad for extra cushion and will place referral for podiatry.   Relevant past medical, surgical, family and social history reviewed and updated as indicated. Interim medical history since our last visit reviewed. Allergies and medications reviewed and updated.  Review of Systems  Constitutional: Negative for fever or weight change.  Respiratory: Negative for cough and shortness of breath.   Cardiovascular: Negative for chest pain or palpitations.  Gastrointestinal: Negative for abdominal pain, no bowel changes.  Musculoskeletal: Negative for gait problem or joint swelling.  Skin: Negative for rash. Positive for callous on left foot Neurological: Negative for dizziness or headache. Positive for seizures No other specific complaints in a complete review of systems (except as listed in HPI above).      Objective:    BP 110/68   Pulse 97   Temp 98.4 F (36.9 C)   Resp 16   Ht 5\' 9"  (1.753 m)   Wt 152 lb (68.9 kg)   LMP 10/28/2020   SpO2 100%   BMI 22.45 kg/m   Wt Readings from Last 3 Encounters:  10/29/20 152 lb (68.9 kg)  08/28/19 160 lb 0.9 oz (72.6 kg)  08/12/19 160 lb (72.6 kg)    Physical Exam  Constitutional: Patient  appears well-developed and well-nourished. No distress.  HEENT: head atraumatic, normocephalic, pupils equal and reactive to light,  neck supple Cardiovascular: Normal rate, regular rhythm and normal heart sounds.  No murmur heard. No BLE edema. Pulmonary/Chest: Effort normal and breath sounds normal. No respiratory distress. Abdominal: Soft.  There is no tenderness. Left foot: callous on bottom of foot, tender, no redness or swelling Psychiatric: Patient has a normal mood and affect. behavior is normal. Judgment and thought content normal.   Results for orders placed or performed during the hospital encounter of 08/12/19  Urine Culture   Specimen: Urine, Random  Result Value Ref Range   Specimen Description      URINE, RANDOM Performed at [facility], [address] [place], [address] [place]    Special Requests      NONE Performed at [facility], [address] [place], [address] [place]    Culture MULTIPLE SPECIES PRESENT, SUGGEST RECOLLECTION (A)    Report Status 08/13/2019 FINAL   CBC  Result Value Ref Range   WBC 7.7 4.0 - 10.5 K/uL   RBC 4.11 3.87 - 5.11 MIL/uL   Hemoglobin 12.4 12.0 - 15.0 g/dL   HCT 02111 10/13/2019 - 55.2 %   MCV 91.5 80.0 - 100.0 fL   MCH 30.2  26.0 - 34.0 pg   MCHC 33.0 30.0 - 36.0 g/dL   RDW 40.9 81.1 - 91.4 %   Platelets 251 150 - 400 K/uL   nRBC 0.0 0.0 - 0.2 %  Basic metabolic panel  Result Value Ref Range   Sodium 139 135 - 145 mmol/L   Potassium 3.7 3.5 - 5.1 mmol/L   Chloride 105 98 - 111 mmol/L   CO2 27 22 - 32 mmol/L   Glucose, Bld 83 70 - 99 mg/dL   BUN 8 6 - 20 mg/dL   Creatinine, Ser 7.82 0.44 - 1.00 mg/dL   Calcium 8.9 8.9 - 95.6 mg/dL   GFR calc non Af Amer >60 >60 mL/min   GFR calc Af Amer >60 >60 mL/min   Anion gap 7 5 - 15  Urinalysis, Complete w Microscopic Urine, Random  Result Value Ref Range   Color, Urine YELLOW (A) YELLOW   APPearance TURBID (A) CLEAR   Specific Gravity, Urine 1.021 1.005 - 1.030    pH 7.0 5.0 - 8.0   Glucose, UA NEGATIVE NEGATIVE mg/dL   Hgb urine dipstick NEGATIVE NEGATIVE   Bilirubin Urine NEGATIVE NEGATIVE   Ketones, ur NEGATIVE NEGATIVE mg/dL   Protein, ur NEGATIVE NEGATIVE mg/dL   Nitrite NEGATIVE NEGATIVE   Leukocytes,Ua NEGATIVE NEGATIVE   RBC / HPF 6-10 0 - 5 RBC/hpf   WBC, UA 0-5 0 - 5 WBC/hpf   Bacteria, UA RARE (A) NONE SEEN   Squamous Epithelial / LPF 0-5 0 - 5   Mucus PRESENT   Urine Drug Screen, Qualitative ([facility] only)  Result Value Ref Range   Tricyclic, Ur Screen NONE DETECTED NONE DETECTED   Amphetamines, Ur Screen NONE DETECTED NONE DETECTED   MDMA (Ecstasy)Ur Screen NONE DETECTED NONE DETECTED   Cocaine Metabolite,Ur [place] NONE DETECTED NONE DETECTED   Opiate, Ur Screen NONE DETECTED NONE DETECTED   Phencyclidine (PCP) Ur S NONE DETECTED NONE DETECTED   Cannabinoid 50 Ng, Ur [place] POSITIVE (A) NONE DETECTED   Barbiturates, Ur Screen NONE DETECTED NONE DETECTED   Benzodiazepine, Ur Scrn NONE DETECTED NONE DETECTED   Methadone Scn, Ur NONE DETECTED NONE DETECTED  Ethanol  Result Value Ref Range   Alcohol, Ethyl (B) <10 <10 mg/dL      Assessment & Plan:   1. Encounter to establish care -get records from previous provider  2. Corn or callus -use OTC corn pads  - Ambulatory referral to Podiatry   Follow up plan: Return in about 3 months (around 01/29/2021) for cpe.

## 2020-11-03 ENCOUNTER — Encounter: Payer: Self-pay | Admitting: Nurse Practitioner

## 2020-11-03 ENCOUNTER — Telehealth (INDEPENDENT_AMBULATORY_CARE_PROVIDER_SITE_OTHER): Payer: Medicaid Other | Admitting: Nurse Practitioner

## 2020-11-03 ENCOUNTER — Other Ambulatory Visit: Payer: Self-pay

## 2020-11-03 DIAGNOSIS — B001 Herpesviral vesicular dermatitis: Secondary | ICD-10-CM | POA: Diagnosis not present

## 2020-11-03 MED ORDER — ACYCLOVIR 400 MG PO TABS: 400.0000 mg | ORAL_TABLET | Freq: Three times a day (TID) | ORAL | 0 refills | Status: AC

## 2020-11-03 NOTE — Progress Notes (Signed)
Name: Mary Wong   MRN: [id number]    DOB: 16-May-1990   Date:11/03/2020       Progress Note  Subjective  Chief Complaint  Chief Complaint  Patient presents with   Mouth Lesions    Painful since yesterday    I connected with  Mary Wong  on 11/03/20 at 11:00 AM EDT by a video enabled telemedicine application and verified that I am speaking with the correct person using two identifiers.  I discussed the limitations of evaluation and management by telemedicine and the availability of in person appointments. The patient expressed understanding and agreed to proceed with a virtual visit  Staff also discussed with the patient that there may be a patient responsible charge related to this service. Patient Location: home Provider Location: [facility] Additional Individuals present: alone  HPI  Lip lesion: She says that yesterday she noticed a cold sore on the center of her bottom lip.  She describes it as a clear pustule.  She says it does tingle. She says she does have  hx of same two years ago.  Discussed prevention and will send in prescription for acyclovir.   Patient Active Problem List   Diagnosis Date Noted   Post concussion syndrome 03/13/2020   Nonintractable juvenile myoclonic epilepsy without status epilepticus (HCC)    Blunt trauma of face    Seizure (HCC) 06/19/2019   Headache disorder 05/23/2017    Social History   Tobacco Use   Smoking status: Every Day    Packs/day: 0.50    Types: Cigarettes   Smokeless tobacco: Never  Substance Use Topics   Alcohol use: No    Comment: occassionally     Current Outpatient Medications:    lamoTRIgine (LAMICTAL) 200 MG tablet, Take 1 tablet by mouth 2 (two) times daily., Disp: , Rfl:   No Known Allergies  I personally reviewed active problem list, medication list, allergies with the patient/caregiver today.  ROS  Constitutional: Negative for fever or weight change.  HEENT: Positive for lesion on  lip Respiratory: Negative for cough and shortness of breath.   Cardiovascular: Negative for chest pain or palpitations.  Gastrointestinal: Negative for abdominal pain, no bowel changes.  Musculoskeletal: Negative for gait problem or joint swelling.  Skin: Negative for rash.  Neurological: Negative for dizziness or headache.  No other specific complaints in a complete review of systems (except as listed in HPI above).   Objective  Virtual encounter, vitals not obtained.  There is no height or weight on file to calculate BMI.  Nursing Note and Vital Signs reviewed.  Physical Exam  Awake, alert and oriented CHE:NIDPO pustule center bottom lip  No results found for this or any previous visit (from the past 72 hour(s)).  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  Dispense: 15 tablet; Refill: 0   -Red flags and when to present for emergency care or RTC including fever >101.3F, chest pain, shortness of breath, new/worsening/un-resolving symptoms, reviewed with patient at time of visit. Follow up and care instructions discussed and provided in AVS. - I discussed the assessment and treatment plan with the patient. The patient was provided an opportunity to ask questions and all were answered. The patient agreed with the plan and demonstrated an understanding of the instructions.  I provided 15 minutes of non-face-to-face time during this encounter.  Berniece Salines, FNP

## 2020-11-04 ENCOUNTER — Ambulatory Visit: Payer: Medicaid Other | Admitting: Podiatry

## 2020-11-04 DIAGNOSIS — B070 Plantar wart: Secondary | ICD-10-CM | POA: Diagnosis not present

## 2020-11-04 NOTE — Progress Notes (Signed)
   Subjective: 30 y.o. female presenting today as a new patient for evaluation of a symptomatic callus corn lesion to the plantar aspect of the left forefoot.  Patient states that about 6 months ago she began working at [employer].  She presents for further treatment evaluation   Past Medical History:  Diagnosis Date   Epilepsy [facility])    Medical history non-contributory    Ovarian cyst    Scoliosis     Objective: Physical Exam General: The patient is alert and oriented x3 in no acute distress.   Dermatology: Hyperkeratotic skin lesion(s) noted to the plantar aspect of the left foot approximately 1 cm in diameter. Pinpoint bleeding noted upon debridement. Skin is warm, dry and supple bilateral lower extremities. Negative for open lesions or macerations.   Vascular: Palpable pedal pulses bilaterally. No edema or erythema noted. Capillary refill within normal limits.   Neurological: Epicritic and protective threshold grossly intact bilaterally.    Musculoskeletal Exam: Pain on palpation to the noted skin lesion(s).  Range of motion within normal limits to all pedal and ankle joints bilateral. Muscle strength 5/5 in all groups bilateral.    Assessment: #1 plantar wart left foot   Plan of Care:  #1 Patient was evaluated. #2 Excisional debridement of the plantar wart lesion(s) was performed using a chisel blade. Salicylic acid was applied and the lesion(s) was dressed with a dry sterile dressing.  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 Felecia Shelling, DPM    [address] [place], [place] [address]                Office [phone]  Fax [phone]

## 2020-12-05 ENCOUNTER — Encounter: Payer: Medicaid Other | Admitting: Podiatry

## 2020-12-08 NOTE — Progress Notes (Signed)
This encounter was created in error - please disregard.

## 2020-12-24 ENCOUNTER — Encounter: Payer: Self-pay | Admitting: Physician Assistant

## 2020-12-24 ENCOUNTER — Ambulatory Visit (INDEPENDENT_AMBULATORY_CARE_PROVIDER_SITE_OTHER): Payer: Medicaid Other | Admitting: Physician Assistant

## 2020-12-24 ENCOUNTER — Other Ambulatory Visit: Payer: Self-pay

## 2020-12-24 DIAGNOSIS — L720 Epidermal cyst: Secondary | ICD-10-CM | POA: Insufficient documentation

## 2020-12-24 NOTE — Patient Instructions (Signed)
It is very likely that you have a cyst under the skin It does not appear inflamed or infected at this time and will likely go away on its own You should try to wear loose fitting clothing and avoid irritation to the area while it is healing to prevent infection.  If it does not begin to resolve over the next 3-4 weeks please come back to the office so we can do further evaluation and testing.

## 2020-12-24 NOTE — Progress Notes (Signed)
Acute Office Visit  Subjective:    Patient ID: Mary Wong, female    DOB: 10/09/1990, 30 y.o.   MRN: [id number]  Chief Complaint  Patient presents with   Breast Mass    Right breast x 1 week pt denies pain.    HPI Patient is in today for right breast lump Noticed about one week ago Denies pain in the area Denies familial breast cancer history LMP: 12/15/2020  Admits that she usually has breast tenderness during menses Denies changes to skin over breast tissue States the area is red but this could be due to irritation from bra   Past Medical History:  Diagnosis Date   Epilepsy [facility])    Medical history non-contributory    Ovarian cyst    Scoliosis     Past Surgical History:  Procedure Laterality Date   CESAREAN SECTION WITH BILATERAL TUBAL LIGATION N/A 02/29/2016   Procedure: CESAREAN SECTION WITH BILATERAL TUBAL LIGATION;  Surgeon: Elenora Fender Ward, MD;  Location: [facility] ORS;  Service: Obstetrics;  Laterality: N/A;   NO PAST SURGERIES      Family History  Problem Relation Age of Onset   Diabetes Mother    Hypertension Mother    Hyperlipidemia Mother    COPD Father     Social History   Socioeconomic History   Marital status: Single    Spouse name: Not on file   Number of children: Not on file   Years of education: Not on file   Highest education level: Not on file  Occupational History   Not on file  Tobacco Use   Smoking status: Every Day    Packs/day: 0.50    Types: Cigarettes   Smokeless tobacco: Never  Substance and Sexual Activity   Alcohol use: No    Comment: occassionally   Drug use: No   Sexual activity: Yes  Other Topics Concern   Not on file  Social History Narrative   Not on file   Social Determinants of Health   Financial Resource Strain: Not on file  Food Insecurity: Not on file  Transportation Needs: Not on file  Physical Activity: Not on file  Stress: Not on file  Social Connections: Not on file  Intimate Partner  Violence: Not on file    Outpatient Medications Prior to Visit  Medication Sig Dispense Refill   lamoTRIgine (LAMICTAL) 200 MG tablet Take 1 tablet by mouth 2 (two) times daily.     No facility-administered medications prior to visit.    No Known Allergies  Review of Systems  Constitutional:  Positive for appetite change (Notes that she does not want to eat after work) and fatigue. Negative for chills, diaphoresis, fever and unexpected weight change.  HENT:  Negative for trouble swallowing.   Respiratory:  Negative for cough and shortness of breath.   Cardiovascular:  Negative for chest pain and palpitations.  Gastrointestinal:  Negative for constipation, diarrhea, nausea and vomiting.  Skin:  Negative for rash.  Neurological:  Positive for headaches (chronic headache hx associated with epilepsy). Negative for dizziness, seizures, syncope, weakness, light-headedness and numbness.      Objective:    Physical Exam Vitals reviewed. Chaperone present: Patient declined chaperone.  Constitutional:      Appearance: Normal appearance. She is normal weight.  HENT:     Head: Normocephalic and atraumatic.  Eyes:     Conjunctiva/sclera: Conjunctivae normal.  Pulmonary:     Effort: Pulmonary effort is normal. No respiratory distress.  Chest:  Breasts:    Breasts are symmetrical.     Right: Skin change present. No mass, nipple discharge or tenderness.     Left: Normal. No mass, nipple discharge or tenderness.    Abdominal:     General: Abdomen is flat.  Musculoskeletal:     Cervical back: Normal range of motion and neck supple.  Lymphadenopathy:     Upper Body:     Right upper body: No supraclavicular, axillary or pectoral adenopathy.     Left upper body: No supraclavicular, axillary or pectoral adenopathy.  Neurological:     Mental Status: She is alert.    BP 110/68    Pulse 95    Temp (!) 97.5 F (36.4 C) (Oral)    Resp 16    Ht 5\' 9"  (1.753 m)    Wt 157 lb 6.4 oz (71.4 kg)     LMP  (LMP Unknown)    SpO2 99%    BMI 23.24 kg/m  Wt Readings from Last 3 Encounters:  12/24/20 157 lb 6.4 oz (71.4 kg)  10/29/20 152 lb (68.9 kg)  08/28/19 160 lb 0.9 oz (72.6 kg)    Health Maintenance Due  Topic Date Due   Pneumococcal Vaccine 54-23 Years old (1 - PCV) Never done   Hepatitis C Screening  Never done   PAP SMEAR-Modifier  Never done    There are no preventive care reminders to display for this patient.   Lab Results  Component Value Date   TSH 3.182 06/19/2019   Lab Results  Component Value Date   WBC 7.7 08/12/2019   HGB 12.4 08/12/2019   HCT 37.6 08/12/2019   MCV 91.5 08/12/2019   PLT 251 08/12/2019   Lab Results  Component Value Date   NA 139 08/12/2019   K 3.7 08/12/2019   CO2 27 08/12/2019   GLUCOSE 83 08/12/2019   BUN 8 08/12/2019   CREATININE 0.78 08/12/2019   BILITOT 0.6 06/17/2019   ALKPHOS 54 06/17/2019   AST 12 (L) 06/17/2019   ALT 10 06/17/2019   PROT 6.8 06/17/2019   ALBUMIN 4.2 06/17/2019   CALCIUM 8.9 08/12/2019   ANIONGAP 7 08/12/2019   No results found for: CHOL No results found for: HDL No results found for: LDLCALC No results found for: TRIG No results found for: CHOLHDL No results found for: 10/12/2019     Assessment & Plan:   Problem List Items Addressed This Visit       Musculoskeletal and Integument   Epidermoid cyst of skin of right breast    Acute issue for one week comprised of approx 1 cm mobile, encapsulated, smooth nodule at 4:00 position of right breast Based upon exam likely noninfected epidermoid cyst but cannot definitively rule out cystic breast change, breast lump without more invasive testing Recommend monitoring at this time and avoidance of irritation to prevent inflammation/infection Follow up in 3-4 weeks if not improving for further evaluation to potentially include mammogram/ diagnostic [id number],          No follow-ups on file.   I, Charina Fons E Chayanne Filippi, PA-C, have reviewed all documentation for  this visit. The documentation on 12/24/20 for the exam, diagnosis, procedures, and orders are all accurate and complete.   Chandi Nicklin, 12/26/20 MPH [facility]    No orders of the defined types were placed in this encounter.    Aerilyn Slee E Cornie Herrington, PA-C

## 2020-12-24 NOTE — Assessment & Plan Note (Signed)
Acute issue for one week comprised of approx 1 cm mobile, encapsulated, smooth nodule at 4:00 position of right breast Based upon exam likely noninfected epidermoid cyst but cannot definitively rule out cystic breast change, breast lump without more invasive testing Recommend monitoring at this time and avoidance of irritation to prevent inflammation/infection Follow up in 3-4 weeks if not improving for further evaluation to potentially include mammogram/ diagnostic US,

## 2021-07-24 ENCOUNTER — Encounter (HOSPITAL_COMMUNITY): Payer: Self-pay

## 2021-07-24 ENCOUNTER — Ambulatory Visit (HOSPITAL_COMMUNITY)
Admission: EM | Admit: 2021-07-24 | Discharge: 2021-07-24 | Disposition: A | Discharge status: Home / Self Care | Payer: Medicaid Other | Attending: Physician Assistant | Admitting: Physician Assistant

## 2021-07-24 DIAGNOSIS — N939 Abnormal uterine and vaginal bleeding, unspecified: Secondary | ICD-10-CM

## 2021-07-24 LAB — POC URINE PREG, ED: Preg Test, Ur: NEGATIVE

## 2021-07-24 NOTE — ED Provider Notes (Signed)
[facility]    CSN: [id number] Arrival date & time: 07/24/21  1240      History   Chief Complaint Chief Complaint  Patient presents with   Vaginal Bleeding    HPI Mary Wong is a 31 y.o. female.   31 year old female presents with vaginal bleeding.  Patient indicates that she has had a bilateral tubal ligation 5 years ago.  Patient indicates that she had a normal menses 10 days ago.  Patient indicates that 4 days ago she started having some light pinkish discharge which increased to more of a red vaginal discharge and this morning she started having vaginal bleeding.  Patient indicates that she is bleeding heavier than usual, and she has used 4-5 pads in the past hour.  Patient indicates she is not having any abdominal pain, cramping, no nausea or vomiting.  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  Patient indicates she is not taking any new medications.  Patient denies fever or chills.  Patient is concerned that she has heavy bleeding with her just finishing her cycle 10 days ago.  Patient requests pregnancy test. Patient relates she does have a GYN specialist that she sees on a regular basis, she was just not able to get off of work today to be seen.   Vaginal Bleeding   Past Medical History:  Diagnosis Date   Epilepsy [facility])    Medical history non-contributory    Ovarian cyst    Scoliosis     Patient Active Problem List   Diagnosis Date Noted   Epidermoid cyst of skin of right breast 12/24/2020   Post concussion syndrome 03/13/2020   Nonintractable juvenile myoclonic epilepsy without status epilepticus (HCC)    Blunt trauma of face    Seizure (HCC) 06/19/2019   Headache disorder 05/23/2017    Past Surgical History:  Procedure Laterality Date   CESAREAN SECTION WITH BILATERAL TUBAL LIGATION N/A 02/29/2016   Procedure: CESAREAN SECTION WITH BILATERAL TUBAL LIGATION;  Surgeon: Elenora Fender Ward, MD;   Location: [facility] ORS;  Service: Obstetrics;  Laterality: N/A;   NO PAST SURGERIES      OB History     Gravida  3   Para  3   Term      Preterm  1   AB      Living  4      SAB      IAB      Ectopic      Multiple  1   Live Births  2        Obstetric Comments  Pt. Is currently 29.[redacted] weeks pregnant          Home Medications    Prior to Admission medications   Medication Sig Start Date End Date Taking? Authorizing Provider  lamoTRIgine (LAMICTAL) 200 MG tablet Take 1 tablet by mouth 2 (two) times daily. 07/01/20 07/01/21  [provider]    Family History Family History  Problem Relation Age of Onset   Diabetes Mother    Hypertension Mother    Hyperlipidemia Mother    COPD Father     Social History Social History   Tobacco Use   Smoking status: Former    Packs/day: 0.50    Types: Cigarettes   Smokeless tobacco: Never  Vaping Use   Vaping Use: Every day   Substances: Nicotine  Substance Use Topics   Alcohol use: No    Comment: occassionally  Drug use: No     Allergies   Patient has no known allergies.   Review of Systems Review of Systems  Genitourinary:  Positive for vaginal bleeding.     Physical Exam Triage Vital Signs ED Triage Vitals  Enc Vitals Group     BP 07/24/21 1316 109/76     Pulse Rate 07/24/21 1316 67     Resp 07/24/21 1316 18     Temp 07/24/21 1316 99.1 F (37.3 C)     Temp Source 07/24/21 1316 Oral     SpO2 07/24/21 1316 95 %     Weight --      Height --      Head Circumference --      Peak Flow --      Pain Score 07/24/21 1317 0     Pain Loc --      Pain Edu? --      Excl. in GC? --    No data found.  Updated Vital Signs BP 109/76 (BP Location: Right Arm)   Pulse 67   Temp 99.1 F (37.3 C) (Oral)   Resp 18   LMP 07/10/2021   SpO2 95%   Visual Acuity Right Eye Distance:   Left Eye Distance:   Bilateral Distance:    Right Eye Near:   Left Eye Near:    Bilateral Near:     Physical  Exam Constitutional:      Appearance: Normal appearance.  Abdominal:     General: Abdomen is flat. Bowel sounds are normal.     Palpations: Abdomen is soft.     Tenderness: There is abdominal tenderness (Left ovary area, mild). There is guarding. There is no rebound.  Genitourinary:    Comments: Pelvic: The labia minora and majora appear normal.  The vaginal vault has a moderate amount of blood present, cervix normal. Neurological:     Mental Status: She is alert.      UC Treatments / Results  Labs (all labs ordered are listed, but only abnormal results are displayed) Labs Reviewed  POC URINE PREG, ED    EKG   Radiology No results found.  Procedures Procedures (including critical care time)  Medications Ordered in UC Medications - No data to display  Initial Impression / Assessment and Plan / UC Course  I have reviewed the triage vital signs and the nursing notes.  Pertinent labs & imaging results that were available during my care of the patient were reviewed by me and considered in my medical decision making (see chart for details).    Plan: 1.  Advised to drink fluids and eat on a regular basis to maintain energy 2.  Patient has been counseled about the vaginal bleeding and to follow-up with her GYN specialist if this fails to resolve on its own over the course of the next 5 to 7 days. 3.  Return to urgent care if symptoms fail to improve Final Clinical Impressions(s) / UC Diagnoses   Final diagnoses:  Vaginal bleeding     Discharge Instructions      Advised watchful waiting as the bleeding may discontinue on its own over the next several days.  Advise if vaginal bleeding does not resolve on its own over the next 5 to 7 days then you should call for an appointment with your GYN specialist for evaluation  Advised if abdominal pain starts, fever, nausea or vomiting then return for reevaluation or to the emergency room.     ED Prescriptions  None     PDMP not reviewed this encounter.   Ellsworth Lennox, PA-C 07/24/21 1428

## 2021-07-24 NOTE — ED Triage Notes (Signed)
Last period was 1 1/2 weeks ago and it was finished. Pt states that she started having brownish/pinkish discharge and today she started having heavy vaginal bleeding. Pt states she used 4 pads in 1 hour. Pt states that it is dark red. Pt has had a tubal ligation. Denies constant abd pain.

## 2021-07-24 NOTE — Discharge Instructions (Addendum)
Advised watchful waiting as the bleeding may discontinue on its own over the next several days.  Advise if vaginal bleeding does not resolve on its own over the next 5 to 7 days then you should call for an appointment with your GYN specialist for evaluation  Advised if abdominal pain starts, fever, nausea or vomiting then return for reevaluation or to the emergency room.

## 2023-03-09 ENCOUNTER — Encounter: Payer: Self-pay | Admitting: Emergency Medicine

## 2023-03-09 ENCOUNTER — Ambulatory Visit
Admission: EM | Admit: 2023-03-09 | Discharge: 2023-03-09 | Disposition: A | Discharge status: Home / Self Care | Attending: Emergency Medicine | Admitting: Emergency Medicine

## 2023-03-09 ENCOUNTER — Other Ambulatory Visit: Payer: Self-pay

## 2023-03-09 DIAGNOSIS — N898 Other specified noninflammatory disorders of vagina: Secondary | ICD-10-CM | POA: Diagnosis not present

## 2023-03-09 MED ORDER — METRONIDAZOLE 500 MG PO TABS: 500.0000 mg | ORAL_TABLET | Freq: Two times a day (BID) | ORAL | 0 refills | Status: DC

## 2023-03-09 NOTE — ED Triage Notes (Signed)
 Patient present to [facility] for evaluation of white vaginal discharge, recent new sexual partner.  Denies belly pain, denies dysuria.

## 2023-03-09 NOTE — Discharge Instructions (Addendum)
Today you are being treated prophylactically for  Bacterial vaginosis   Take Metronidazole 500 mg twice a day for 7 days, do not drink alcohol while using medication, this will make you feel sick   Bacterial vaginosis which results from an overgrowth of one on several organisms that are normally present in your vagina. Vaginosis is an inflammation of the vagina that can result in discharge, itching and pain.  Labs pending 2-3 days, you will be contacted if positive for any sti and treatment will be sent to the pharmacy, you will have to return to the clinic if positive for gonorrhea to receive treatment   Please refrain from having sex until labs results, if positive please refrain from having sex until treatment complete and symptoms resolve   If positive for , Chlamydia  gonorrhea or trichomoniasis please notify partner or partners so they may tested as well  Moving forward, it is recommended you use some form of protection against the transmission of sti infections  such as condoms or dental dams with each sexual encounter     In addition: Avoid baths, hot tubs and whirlpool spas.  Don't use scented or harsh soaps Avoid irritants. These include scented tampons and pads. Wipe from front to back after using the toilet. Don't douche. Your vagina doesn't require cleansing other than normal bathing.  Use a condom.  Wear cotton underwear, this fabric absorbs some moisture.      

## 2023-03-09 NOTE — ED Provider Notes (Signed)
 [name]    CSN: [id number] Arrival date & time: 03/09/23  1824      History   Chief Complaint Chief Complaint  Patient presents with   Vaginal Discharge    HPI Mary Wong is a 33 y.o. female.   Patient presents for evaluation of Mary Wong thick vaginal discharge with odor present for 2 to 3 days.  Symptoms began after sexual encounter with new partner.  Denies vaginal itching, irritation, abdominal or flank pain, urinary symptoms.  Has not attempted treatment.  Bilateral tubal ligation.  Past Medical History:  Diagnosis Date   Epilepsy [facility])    Medical history non-contributory    Ovarian cyst    Scoliosis     Patient Active Problem List   Diagnosis Date Noted   Epidermoid cyst of skin of right breast 12/24/2020   Post concussion syndrome 03/13/2020   Nonintractable juvenile myoclonic epilepsy without status epilepticus (HCC)    Blunt trauma of face    Seizure (HCC) 06/19/2019   Headache disorder 05/23/2017    Past Surgical History:  Procedure Laterality Date   CESAREAN SECTION WITH BILATERAL TUBAL LIGATION N/A 02/29/2016   Procedure: CESAREAN SECTION WITH BILATERAL TUBAL LIGATION;  Surgeon: Elenora Fender Ward, MD;  Location: [facility] ORS;  Service: Obstetrics;  Laterality: N/A;   NO PAST SURGERIES      OB History     Gravida  3   Para  3   Term      Preterm  1   AB      Living  4      SAB      IAB      Ectopic      Multiple  1   Live Births  2        Obstetric Comments  Pt. Is currently 29.[redacted] weeks pregnant          Home Medications    Prior to Admission medications   Medication Sig Start Date End Date Taking? Authorizing Provider  metroNIDAZOLE (FLAGYL) 500 MG tablet Take 1 tablet (500 mg total) by mouth 2 (two) times daily. 03/09/23  Yes Laurielle Selmon, Elita Boone, NP  lamoTRIgine (LAMICTAL) 200 MG tablet Take 1 tablet by mouth 2 (two) times daily. 07/01/20 07/01/21  [provider]    Family History Family History   Problem Relation Age of Onset   Diabetes Mother    Hypertension Mother    Hyperlipidemia Mother    COPD Father     Social History Social History   Tobacco Use   Smoking status: Former    Current packs/day: 0.50    Types: Cigarettes   Smokeless tobacco: Never  Vaping Use   Vaping status: Every Day   Substances: Nicotine  Substance Use Topics   Alcohol use: No    Comment: occassionally   Drug use: No     Allergies   Patient has no known allergies.   Review of Systems Review of Systems   Physical Exam Triage Vital Signs ED Triage Vitals  Encounter Vitals Group     BP 03/09/23 1839 116/65     Systolic BP Percentile --      Diastolic BP Percentile --      Pulse Rate 03/09/23 1839 86     Resp 03/09/23 1839 16     Temp 03/09/23 1839 97.8 F (36.6 C)     Temp Source 03/09/23 1839 Temporal     SpO2 03/09/23 1839 98 %  Weight --      Height --      Head Circumference --      Peak Flow --      Pain Score 03/09/23 1840 0     Pain Loc --      Pain Education --      Exclude from Growth Chart --    No data found.  Updated Vital Signs BP 116/65 (BP Location: Left Arm)   Pulse 86   Temp 97.8 F (36.6 C) (Temporal)   Resp 16   SpO2 98%   Visual Acuity Right Eye Distance:   Left Eye Distance:   Bilateral Distance:    Right Eye Near:   Left Eye Near:    Bilateral Near:     Physical Exam Constitutional:      Appearance: Normal appearance.  Eyes:     Extraocular Movements: Extraocular movements intact.  Pulmonary:     Effort: Pulmonary effort is normal.  Genitourinary:    Comments: deferred Neurological:     Mental Status: She is alert and oriented to person, place, and time. Mental status is at baseline.      UC Treatments / Results  Labs (all labs ordered are listed, but only abnormal results are displayed) Labs Reviewed  CERVICOVAGINAL ANCILLARY ONLY    EKG   Radiology No results found.  Procedures Procedures (including critical  care time)  Medications Ordered in UC Medications - No data to display  Initial Impression / Assessment and Plan / UC Course  I have reviewed the triage vital signs and the nursing notes.  Pertinent labs & imaging results that were available during my care of the patient were reviewed by me and considered in my medical decision making (see chart for details).  Vaginal discharge   Treating prophylactically for BV, metronidazole prescribed, advised abstinence from alcohol during use, declined HIV and syphilis , STI labs pending will treat per protocol, advised abstinence until lab results, and/or treatment is complete, advised condom use during all sexual encounters moving, may follow-up with urgent care as needed  Final Clinical Impressions(s) / UC Diagnoses   Final diagnoses:  Vaginal discharge     Discharge Instructions      Today you are being treated prophylactically for  Bacterial vaginosis   Take Metronidazole 500 mg twice a day for 7 days, do not drink alcohol while using medication, this will make you feel sick   Bacterial vaginosis which results from an overgrowth of one on several organisms that are normally present in your vagina. Vaginosis is an inflammation of the vagina that can result in discharge, itching and pain.  Labs pending 2-3 days, you will be contacted if positive for any sti and treatment will be sent to the pharmacy, you will have to return to the clinic if positive for gonorrhea to receive treatment   Please refrain from having sex until labs results, if positive please refrain from having sex until treatment complete and symptoms resolve   If positive for , Chlamydia  gonorrhea or trichomoniasis please notify partner or partners so they may tested as well  Moving forward, it is recommended you use some form of protection against the transmission of sti infections  such as condoms or dental dams with each sexual encounter     In addition: Avoid baths,  hot tubs and whirlpool spas.  Don't use scented or harsh soaps Avoid irritants. These include scented tampons and pads. Wipe from front to back after using the  toilet. Don't douche. Your vagina doesn't require cleansing other than normal bathing.  Use a condom.  Wear cotton underwear, this fabric absorbs some moisture.        ED Prescriptions     Medication Sig Dispense Auth. Provider   metroNIDAZOLE (FLAGYL) 500 MG tablet Take 1 tablet (500 mg total) by mouth 2 (two) times daily. 14 tablet Jordy Hewins, Elita Boone, NP      PDMP not reviewed this encounter.   Valinda Hoar, [place] 03/09/23 [phone]

## 2023-03-10 LAB — CERVICOVAGINAL ANCILLARY ONLY
Bacterial Vaginitis (gardnerella): POSITIVE — AB
Candida Glabrata: NEGATIVE
Candida Vaginitis: NEGATIVE
Chlamydia: NEGATIVE
Comment: NEGATIVE
Comment: NEGATIVE
Comment: NEGATIVE
Comment: NEGATIVE
Comment: NEGATIVE
Comment: NORMAL
Neisseria Gonorrhea: NEGATIVE
Trichomonas: NEGATIVE

## 2023-04-05 ENCOUNTER — Emergency Department

## 2023-04-05 ENCOUNTER — Ambulatory Visit: Admission: EM | Admit: 2023-04-05 | Discharge: 2023-04-05 | Disposition: A | Discharge status: Home / Self Care

## 2023-04-05 ENCOUNTER — Emergency Department
Admission: EM | Admit: 2023-04-05 | Discharge: 2023-04-05 | Disposition: A | Discharge status: Home / Self Care | Attending: Emergency Medicine | Admitting: Emergency Medicine

## 2023-04-05 ENCOUNTER — Other Ambulatory Visit: Payer: Self-pay

## 2023-04-05 DIAGNOSIS — R079 Chest pain, unspecified: Secondary | ICD-10-CM

## 2023-04-05 DIAGNOSIS — R0789 Other chest pain: Secondary | ICD-10-CM | POA: Insufficient documentation

## 2023-04-05 LAB — BASIC METABOLIC PANEL WITH GFR
Anion gap: 7 (ref 5–15)
BUN: 11 mg/dL (ref 6–20)
CO2: 24 mmol/L (ref 22–32)
Calcium: 9 mg/dL (ref 8.9–10.3)
Chloride: 106 mmol/L (ref 98–111)
Creatinine, Ser: 0.54 mg/dL (ref 0.44–1.00)
GFR, Estimated: >60 mL/min (ref >60)
Glucose, Bld: 72 mg/dL (ref 70–99)
Potassium: 3.5 mmol/L (ref 3.5–5.1)
Sodium: 137 mmol/L (ref 135–145)

## 2023-04-05 LAB — TROPONIN I (HIGH SENSITIVITY)
Troponin I (High Sensitivity): <2 ng/L (ref <18)
Troponin I (High Sensitivity): <2 ng/L (ref <18)

## 2023-04-05 LAB — CBC WITH DIFFERENTIAL/PLATELET
Abs Immature Granulocytes: 0.02 10*3/uL (ref 0.00–0.07)
Basophils Absolute: 0.1 10*3/uL (ref 0.0–0.1)
Basophils Relative: 1 %
Eosinophils Absolute: 0.1 10*3/uL (ref 0.0–0.5)
Eosinophils Relative: 1 %
HCT: 32.6 % — ABNORMAL LOW (ref 36.0–46.0)
Hemoglobin: 10.4 g/dL — ABNORMAL LOW (ref 12.0–15.0)
Immature Granulocytes: 0 %
Lymphocytes Relative: 19 %
Lymphs Abs: 2.2 10*3/uL (ref 0.7–4.0)
MCH: 29.1 pg (ref 26.0–34.0)
MCHC: 31.9 g/dL (ref 30.0–36.0)
MCV: 91.3 fL (ref 80.0–100.0)
Monocytes Absolute: 0.7 10*3/uL (ref 0.1–1.0)
Monocytes Relative: 6 %
Neutro Abs: 8.1 10*3/uL — ABNORMAL HIGH (ref 1.7–7.7)
Neutrophils Relative %: 73 %
Platelets: 231 10*3/uL (ref 150–400)
RBC: 3.57 MIL/uL — ABNORMAL LOW (ref 3.87–5.11)
RDW: 14 % (ref 11.5–15.5)
WBC: 11.2 10*3/uL — ABNORMAL HIGH (ref 4.0–10.5)
nRBC: 0 % (ref 0.0–0.2)

## 2023-04-05 LAB — D-DIMER, QUANTITATIVE: D-Dimer, Quant: 0.28 ug{FEU}/mL (ref 0.00–0.50)

## 2023-04-05 NOTE — ED Provider Notes (Signed)
 Mary Wong    CSN: [id number] Arrival date & time: 04/05/23  1558      History   Chief Complaint Chief Complaint  Patient presents with   Cough    HPI Mary Wong is a 33 y.o. female.  Patient presents with right side chest pain x 2 days.  No trauma.  Patient states her symptoms started while at work.  Her work requires movement and lifting.  The chest pain is worse with movement and deep breathing.  Patient feels that she is unable to take a deep breath due to the pain.  She denies fever, chills, focal weakness, numbness, shortness of breath, wounds, bruising, redness, rash.  Patient vapes daily.  The history is provided by the patient and medical records.    Past Medical History:  Diagnosis Date   Epilepsy [facility])    Medical history non-contributory    Ovarian cyst    Scoliosis     Patient Active Problem List   Diagnosis Date Noted   Epidermoid cyst of skin of right breast 12/24/2020   Post concussion syndrome 03/13/2020   Nonintractable juvenile myoclonic epilepsy without status epilepticus (HCC)    Blunt trauma of face    Seizure (HCC) 06/19/2019   Headache disorder 05/23/2017    Past Surgical History:  Procedure Laterality Date   CESAREAN SECTION WITH BILATERAL TUBAL LIGATION N/A 02/29/2016   Procedure: CESAREAN SECTION WITH BILATERAL TUBAL LIGATION;  Surgeon: Elenora Fender Ward, MD;  Location: [facility] ORS;  Service: Obstetrics;  Laterality: N/A;   NO PAST SURGERIES      OB History     Gravida  3   Para  3   Term      Preterm  1   AB      Living  4      SAB      IAB      Ectopic      Multiple  1   Live Births  2        Obstetric Comments  Pt. Is currently 29.[redacted] weeks pregnant          Home Medications    Prior to Admission medications   Medication Sig Start Date End Date Taking? Authorizing Provider  lamoTRIgine (LAMICTAL) 200 MG tablet Take 1 tablet by mouth 2 (two) times daily. Patient not taking: Reported on  04/05/2023 07/01/20 07/01/21  [provider]  metroNIDAZOLE (FLAGYL) 500 MG tablet Take 1 tablet (500 mg total) by mouth 2 (two) times daily. Patient not taking: Reported on 04/05/2023 03/09/23   Valinda Hoar, NP    Family History Family History  Problem Relation Age of Onset   Diabetes Mother    Hypertension Mother    Hyperlipidemia Mother    COPD Father     Social History Social History   Tobacco Use   Smoking status: Former    Current packs/day: 0.50    Types: Cigarettes   Smokeless tobacco: Never  Vaping Use   Vaping status: Every Day   Substances: Nicotine  Substance Use Topics   Alcohol use: No    Comment: occassionally   Drug use: No     Allergies   Patient has no known allergies.   Review of Systems Review of Systems  Constitutional:  Negative for chills and fever.  Respiratory:  Positive for shortness of breath. Negative for cough.   Cardiovascular:  Positive for chest pain. Negative for palpitations.  Musculoskeletal:  Positive for myalgias.  Skin:  Negative for color change, rash and wound.  Neurological:  Negative for weakness and numbness.     Physical Exam Triage Vital Signs ED Triage Vitals  Encounter Vitals Group     BP 04/05/23 1617 (!) 112/59     Systolic BP Percentile --      Diastolic BP Percentile --      Pulse Rate 04/05/23 1617 80     Resp 04/05/23 1617 17     Temp 04/05/23 1617 98 F (36.7 C)     Temp src --      SpO2 04/05/23 1617 99 %     Weight --      Height --      Head Circumference --      Peak Flow --      Pain Score 04/05/23 1612 9     Pain Loc --      Pain Education --      Exclude from Growth Chart --    No data found.  Updated Vital Signs BP (!) 112/59   Pulse 80   Temp 98 F (36.7 C)   Resp 17   SpO2 99%   Visual Acuity Right Eye Distance:   Left Eye Distance:   Bilateral Distance:    Right Eye Near:   Left Eye Near:    Bilateral Near:     Physical Exam Constitutional:      General:  She is not in acute distress. HENT:     Mouth/Throat:     Mouth: Mucous membranes are moist.  Cardiovascular:     Rate and Rhythm: Normal rate and regular rhythm.     Heart sounds: Normal heart sounds.  Pulmonary:     Effort: Pulmonary effort is normal. No respiratory distress.     Breath sounds: Normal breath sounds.  Chest:     Chest wall: Tenderness present.  Musculoskeletal:        General: Tenderness present. No swelling or deformity. Normal range of motion.     Comments: Right side chest wall tender to palpation.  Skin:    General: Skin is warm and dry.     Findings: No bruising, erythema, lesion or rash.  Neurological:     General: No focal deficit present.     Mental Status: She is alert and oriented to person, place, and time.     Sensory: No sensory deficit.     Motor: No weakness.     Gait: Gait normal.      UC Treatments / Results  Labs (all labs ordered are listed, but only abnormal results are displayed) Labs Reviewed - No data to display  EKG   Radiology No results found.  Procedures Procedures (including critical care time)  Medications Ordered in UC Medications - No data to display  Initial Impression / Assessment and Plan / UC Course  I have reviewed the triage vital signs and the nursing notes.  Pertinent labs & imaging results that were available during my care of the patient were reviewed by me and considered in my medical decision making (see chart for details).    Chest pain.  Afebrile and vital signs are stable.  Discussed limitations of evaluation of chest pain in an urgent care setting.  Discussed that we can do an EKG here and send her for outpatient x-ray.  She prefers to be transferred to the ED.  Sending her to [facility] ED for evaluation.  She declines EMS and feels stable for transport to [facility]  ED via POV.   Final Clinical Impressions(s) / UC Diagnoses   Final diagnoses:  Chest pain, unspecified type     Discharge  Instructions      Go to the emergency department for evaluation of your chest pain.     ED Prescriptions   None    I have reviewed the PDMP during this encounter.   Mickie Bail, NP 04/05/23 [phone]

## 2023-04-05 NOTE — ED Provider Notes (Signed)
 [facility] Provider Note    Event Date/Time   First MD Initiated Contact with Patient 04/05/23 2104     (approximate)  History   Chief Complaint: Chest Pain  HPI  Amanat Shilpa Bushee is a 33 y.o. female with a past medical history of epilepsy, presents to the emergency department for chest pain.  According to the patient for the past 3 days she has been experiencing intermittent pain in the center of her chest.  Patient states the pain is worse with movements or if she takes a deep breath.  Patient states she does use a vape pen daily which she relates her discomfort to.  Patient denies any shortness of breath denies any leg pain or swelling.  Patient went to the walk-in clinic and was referred to the emergency department for further evaluation.  Physical Exam   Triage Vital Signs: ED Triage Vitals  Encounter Vitals Group     BP 04/05/23 1740 117/65     Systolic BP Percentile --      Diastolic BP Percentile --      Pulse Rate 04/05/23 1740 85     Resp 04/05/23 1740 18     Temp 04/05/23 1740 98 F (36.7 C)     Temp src --      SpO2 04/05/23 1740 98 %     Weight --      Height --      Head Circumference --      Peak Flow --      Pain Score 04/05/23 1744 7     Pain Loc --      Pain Education --      Exclude from Growth Chart --     Most recent vital signs: Vitals:   04/05/23 1740  BP: 117/65  Pulse: 85  Resp: 18  Temp: 98 F (36.7 C)  SpO2: 98%    General: Awake, no distress.  CV:  Good peripheral perfusion.  Regular rate and rhythm  Resp:  Normal effort.  Equal breath sounds bilaterally.  Abd:  No distention.  Soft, nontender.  No rebound or guarding.  ED Results / Procedures / Treatments   EKG  EKG viewed and interpreted by myself shows a normal sinus rhythm 85 bpm with a narrow QRS, normal axis, normal intervals, no concerning ST changes.  RADIOLOGY  I have reviewed and interpreted chest x-ray images.  No consolidation seen on  my evaluation. Radiology is read the x-ray as negative.   MEDICATIONS ORDERED IN ED: Medications - No data to display   IMPRESSION / MDM / ASSESSMENT AND PLAN / ED COURSE  I reviewed the triage vital signs and the nursing notes.  Patient's presentation is most consistent with acute presentation with potential threat to life or bodily function.  Patient presents to the emergency department for chest pain intermittent over the last 3 days.  Overall the patient appears well.  Lab work is reassuring with a normal CBC, reassuring chemistry, negative troponin.  Chest x-ray is clear and EKG reassuring.  However given the patient's description of pleuritic pain will obtain a D-dimer to rule out pulmonary embolism, low suspicion.  If the D-dimer and repeat troponin are negative I believe the patient can be safely discharged home with outpatient follow-up.  Patient agreeable to plan of care.  Patient's repeat is negative, D-dimer is negative.  Given the patient's reassuring workup I believe the patient safe for discharge home with outpatient follow-up.  Patient reassured  and agreeable to plan.  FINAL CLINICAL IMPRESSION(S) / ED DIAGNOSES   Chest pain   Note:  This document was prepared using Dragon voice recognition software and may include unintentional dictation errors.   Minna Antis, MD 04/05/23 2225

## 2023-04-05 NOTE — ED Provider Triage Note (Signed)
 Emergency Medicine Provider Triage Evaluation Note  Mary Wong , a 33 y.o. female  was evaluated in triage.  Pt complains of chest pain that began on Sunday. Pain is worse today, has pain with coughing, laughing, sneezing, yawning. Pain is on the right side of the chest.   Review of Systems  Positive: Chest pain, SOB Negative: N/v  Physical Exam  There were no vitals taken for this visit. Gen:   Awake, no distress   Resp:  Normal effort  MSK:   Moves extremities without difficulty  Other:    Medical Decision Making  Medically screening exam initiated at 5:38 PM.  Appropriate orders placed.  Mary Wong was informed that the remainder of the evaluation will be completed by another provider, this initial triage assessment does not replace that evaluation, and the importance of remaining in the ED until their evaluation is complete.     Cameron Ali, PA-C 04/05/23 1740

## 2023-04-05 NOTE — Discharge Instructions (Signed)
Go to the emergency department for evaluation of your chest pain.   

## 2023-04-05 NOTE — ED Notes (Signed)
 Patient is being discharged from the Urgent Care and sent to the Emergency Department via POV . Per Wendee Beavers NP, patient is in need of higher level of care due to chest pain. Patient is aware and verbalizes understanding of plan of care.  Vitals:   04/05/23 1617  BP: (!) 112/59  Pulse: 80  Resp: 17  Temp: 98 F (36.7 C)  SpO2: 99%

## 2023-04-05 NOTE — ED Triage Notes (Addendum)
 Patient to Urgent Care with complaints of right/ upper rib pain. Worsens with movement/ bending/ yawning/ coughing/ sneezing.   Symptoms started Sunday. Denies any known injury.   Patient vapes daily.

## 2023-04-05 NOTE — ED Triage Notes (Signed)
 Pt to ED via POV from UC. Pt reports right sided CP that is worse with movement, coughing, sneezing since Sunday. Pt vapes daily.

## 2023-07-01 ENCOUNTER — Ambulatory Visit
Admission: EM | Admit: 2023-07-01 | Discharge: 2023-07-01 | Disposition: A | Discharge status: Home / Self Care | Attending: Physician Assistant | Admitting: Physician Assistant

## 2023-07-01 DIAGNOSIS — N898 Other specified noninflammatory disorders of vagina: Secondary | ICD-10-CM | POA: Insufficient documentation

## 2023-07-01 MED ORDER — METRONIDAZOLE 500 MG PO TABS: 500.0000 mg | ORAL_TABLET | Freq: Two times a day (BID) | ORAL | 0 refills | Status: AC

## 2023-07-01 NOTE — Discharge Instructions (Signed)
We are treating you for bacterial vaginosis.  Take metronidazole twice daily for 7 days.  Do not drink any alcohol while on this medication as it will cause you to vomit.  We will contact you if any of your other testing is abnormal and we need to arrange additional treatment.  Abstain from sex until you receive the results.  Use a condom for the sexual encounter.  Use hypoallergenic soaps and detergents and wear loosefitting cotton underwear.  If you have any worsening or changing symptoms including abnormal discharge, pelvic pain, abdominal pain, fever, nausea, vomiting you should be reevaluated.

## 2023-07-01 NOTE — ED Provider Notes (Signed)
 CAY RALPH PELT    CSN: [id number] Arrival date & time: 07/01/23  1737      History   Chief Complaint Chief Complaint  Patient presents with   Vaginal Discharge    HPI Kendalynn Kaliana Albino is a 33 y.o. female.   Patient presents today with a 1 week history of vaginal discharge.  She describes this as copious and malodorous.  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  She reports taking multiple measures to prevent recurrent BV including using hypoallergenic soaps and detergents and wearing loosefitting cotton underwear.  She has never tried boric acid suppositories.  She denies any pelvic pain, abdominal pain, fever, nausea, vomiting.  She is confident that she is not pregnant.  She has no specific concern for STI but is open to STI testing with vaginal swab.  She declined HIV and syphilis testing.    Past Medical History:  Diagnosis Date   Epilepsy [facility])    Medical history non-contributory    Ovarian cyst    Scoliosis     Patient Active Problem List   Diagnosis Date Noted   Epidermoid cyst of skin of right breast 12/24/2020   Post concussion syndrome 03/13/2020   Nonintractable juvenile myoclonic epilepsy without status epilepticus (HCC)    Blunt trauma of face    Seizure (HCC) 06/19/2019   Headache disorder 05/23/2017    Past Surgical History:  Procedure Laterality Date   CESAREAN SECTION WITH BILATERAL TUBAL LIGATION N/A 02/29/2016   Procedure: CESAREAN SECTION WITH BILATERAL TUBAL LIGATION;  Surgeon: Mitzie BROCKS Ward, MD;  Location: [facility] ORS;  Service: Obstetrics;  Laterality: N/A;   NO PAST SURGERIES      OB History     Gravida  3   Para  3   Term      Preterm  1   AB      Living  4      SAB      IAB      Ectopic      Multiple  1   Live Births  2        Obstetric Comments  Pt. Is currently 29.[redacted] weeks pregnant          Home Medications    Prior to Admission medications   Medication Sig Start  Date End Date Taking? Authorizing Provider  metroNIDAZOLE  (FLAGYL ) 500 MG tablet Take 1 tablet (500 mg total) by mouth 2 (two) times daily. 07/01/23  Yes Athanasios Heldman, Rocky POUR, PA-C    Family History Family History  Problem Relation Age of Onset   Diabetes Mother    Hypertension Mother    Hyperlipidemia Mother    COPD Father     Social History Social History   Tobacco Use   Smoking status: Former    Current packs/day: 0.50    Types: Cigarettes   Smokeless tobacco: Never  Vaping Use   Vaping status: Every Day   Substances: Nicotine  Substance Use Topics   Alcohol use: No    Comment: occassionally   Drug use: No     Allergies   Patient has no known allergies.   Review of Systems Review of Systems  Constitutional:  Negative for activity change, appetite change, fatigue and fever.  Gastrointestinal:  Negative for abdominal pain, diarrhea, nausea and vomiting.  Genitourinary:  Positive for vaginal discharge. Negative for dysuria, frequency, pelvic pain, urgency, vaginal bleeding and vaginal pain.     Physical Exam  Triage Vital Signs ED Triage Vitals  Encounter Vitals Group     BP 07/01/23 1752 122/74     Girls Systolic BP Percentile --      Girls Diastolic BP Percentile --      Boys Systolic BP Percentile --      Boys Diastolic BP Percentile --      Pulse Rate 07/01/23 1752 82     Resp 07/01/23 1752 18     Temp 07/01/23 1752 97.8 F (36.6 C)     Temp src --      SpO2 07/01/23 1752 97 %     Weight --      Height --      Head Circumference --      Peak Flow --      Pain Score 07/01/23 1748 0     Pain Loc --      Pain Education --      Exclude from Growth Chart --    No data found.  Updated Vital Signs BP 122/74   Pulse 82   Temp 97.8 F (36.6 C)   Resp 18   SpO2 97%   Visual Acuity Right Eye Distance:   Left Eye Distance:   Bilateral Distance:    Right Eye Near:   Left Eye Near:    Bilateral Near:     Physical Exam Vitals reviewed.   Constitutional:      General: She is awake. She is not in acute distress.    Appearance: Normal appearance. She is well-developed. She is not ill-appearing.     Comments: Very pleasant female appears stated age in no acute distress sitting comfortably in exam room  HENT:     Head: Normocephalic and atraumatic.   Cardiovascular:     Rate and Rhythm: Normal rate and regular rhythm.     Heart sounds: Normal heart sounds, S1 normal and S2 normal. No murmur heard. Pulmonary:     Effort: Pulmonary effort is normal.     Breath sounds: Normal breath sounds. No wheezing, rhonchi or rales.     Comments: Clear to auscultation bilaterally Abdominal:     General: Bowel sounds are normal.     Palpations: Abdomen is soft.     Tenderness: There is no abdominal tenderness. There is no right CVA tenderness, left CVA tenderness, guarding or rebound.     Comments: Benign abdominal exam  Genitourinary:    Comments: Exam deferred  Psychiatric:        Behavior: Behavior is cooperative.      UC Treatments / Results  Labs (all labs ordered are listed, but only abnormal results are displayed) Labs Reviewed  CERVICOVAGINAL ANCILLARY ONLY    EKG   Radiology No results found.  Procedures Procedures (including critical care time)  Medications Ordered in UC Medications - No data to display  Initial Impression / Assessment and Plan / UC Course  I have reviewed the triage vital signs and the nursing notes.  Pertinent labs & imaging results that were available during my care of the patient were reviewed by me and considered in my medical decision making (see chart for details).     Patient is well-appearing, afebrile, nontoxic, nontachycardic.  Vital signs of physical exam are reassuring.  STI swab was collected and is pending.  Will empirically treat for BV given clinical presentation and she was started on metronidazole  500 mg twice daily for 7 days.  We discussed that she is to avoid alcohol  while  on this medication for 3 days after completing course due to associated photosensitivity.  Given her current symptoms we did discuss potential utility of treating her partner and she was encouraged to have him follow-up with our clinic if this continues to be a problem and he is interested in doing so.  We also discussed that she can use boric acid suppositories as another strategy to prevent recurrence.  She was encouraged to use hypoallergenic soaps and detergents and wear loosefitting cotton underwear.  We discussed that if anything worsens or changes and she develops pelvic pain, abdominal pain, fever, nausea, vomiting she needs to be seen immediately.  Strict return precautions given.   Final Clinical Impressions(s) / UC Diagnoses   Final diagnoses:  Vaginal discharge     Discharge Instructions      We are treating you for bacterial vaginosis.  Take metronidazole  twice daily for 7 days.  Do not drink any alcohol while on this medication as it will cause you to vomit.  We will contact you if any of your other testing is abnormal and we need to arrange additional treatment.  Abstain from sex until you receive the results.  Use a condom for the sexual encounter.  Use hypoallergenic soaps and detergents and wear loosefitting cotton underwear.  If you have any worsening or changing symptoms including abnormal discharge, pelvic pain, abdominal pain, fever, nausea, vomiting you should be reevaluated.     ED Prescriptions     Medication Sig Dispense Auth. Provider   metroNIDAZOLE  (FLAGYL ) 500 MG tablet Take 1 tablet (500 mg total) by mouth 2 (two) times daily. 14 tablet Shelley Pooley K, PA-C      PDMP not reviewed this encounter.   Sherrell Rocky POUR, PA-C 07/01/23 1804

## 2023-07-01 NOTE — ED Triage Notes (Signed)
 Patient to Urgent Care with complaints of vaginal discharge.  Symptoms x1 week. Concerned about BV. Would also like STD testing.

## 2023-07-05 ENCOUNTER — Ambulatory Visit (HOSPITAL_COMMUNITY): Payer: Self-pay

## 2023-07-05 LAB — CERVICOVAGINAL ANCILLARY ONLY
Bacterial Vaginitis (gardnerella): POSITIVE — AB
Candida Glabrata: NEGATIVE
Candida Vaginitis: NEGATIVE
Chlamydia: NEGATIVE
Comment: NEGATIVE
Comment: NEGATIVE
Comment: NEGATIVE
Comment: NEGATIVE
Comment: NEGATIVE
Comment: NORMAL
Neisseria Gonorrhea: NEGATIVE
Trichomonas: NEGATIVE
